# Patient Record
Sex: Male | Born: 1990 | Hispanic: No | Marital: Single | Smoking: Never smoker
Health system: Southern US, Community
[De-identification: ages and names within clinical notes are randomized; demographics above are authoritative.]

## PROBLEM LIST (undated history)

## (undated) HISTORY — PX: NO PAST SURGERIES: SHX2092

---
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
## 2017-12-01 ENCOUNTER — Other Ambulatory Visit: Payer: Self-pay

## 2017-12-01 ENCOUNTER — Ambulatory Visit
Admission: EM | Admit: 2017-12-01 | Discharge: 2017-12-01 | Disposition: A | Discharge status: Home / Self Care | Payer: BLUE CROSS/BLUE SHIELD | Attending: Emergency Medicine | Admitting: Emergency Medicine

## 2017-12-01 DIAGNOSIS — H9202 Otalgia, left ear: Secondary | ICD-10-CM | POA: Diagnosis not present

## 2017-12-01 DIAGNOSIS — H66002 Acute suppurative otitis media without spontaneous rupture of ear drum, left ear: Secondary | ICD-10-CM | POA: Diagnosis not present

## 2017-12-01 DIAGNOSIS — H6982 Other specified disorders of Eustachian tube, left ear: Secondary | ICD-10-CM

## 2017-12-01 MED ORDER — AMOXICILLIN 875 MG PO TABS: 875.0000 mg | ORAL_TABLET | Freq: Two times a day (BID) | ORAL | 0 refills | Status: AC

## 2017-12-01 MED ORDER — FLUTICASONE PROPIONATE 50 MCG/ACT NA SUSP: 2.0000 | Freq: Two times a day (BID) | NASAL | 2 refills | Status: DC

## 2017-12-01 NOTE — Discharge Instructions (Addendum)
Recommend start Amoxicillin 875mg  twice a day as directed. Start Flonase 2 sprays in left nostril twice a day for next 5 to 7 days. Continue Ibuprofen 600mg  every 6 hours as needed for pain. Follow-up here in 3 days if not improving.

## 2017-12-01 NOTE — ED Triage Notes (Signed)
Patient complains of left ear pain x 6 days that has been worsening. Patient reports that he has been taking allergy medication without relief.

## 2017-12-02 NOTE — ED Provider Notes (Signed)
[facility]    CSN: [id number] Arrival date & time: 12/01/17  1804     History   Chief Complaint Chief Complaint  Patient presents with  . Otalgia    left    HPI Nicholas [name] is a 27 y.o. male.   27 year old male presents with left sided ear pain for over a week that has become more painful in the past 2 days. Also having some dizziness, nausea and slight nasal congestion. Denies any fever, sore throat, cough or vomiting/diarrhea. He has taken OTC allergy medications, Ibuprofen and used hydrogen peroxide in his left ear with no relief. No other family members ill. No other chronic health issues. Takes no daily medication.   The history is provided by the patient.    History reviewed. No pertinent past medical history.  There are no active problems to display for this patient.   Past Surgical History:  Procedure Laterality Date  . NO PAST SURGERIES         Home Medications    Prior to Admission medications   Medication Sig Start Date End Date Taking? Authorizing Provider  amoxicillin (AMOXIL) 875 MG tablet Take 1 tablet (875 mg total) by mouth 2 (two) times daily for 7 days. 12/01/17 12/08/17  Sudie Grumbling, NP  fluticasone (FLONASE) 50 MCG/ACT nasal spray Place 2 sprays into both nostrils 2 (two) times daily for 7 days. 12/01/17 12/08/17  Sudie Grumbling, NP    Family History History reviewed. No pertinent family history.  Social History Social History   Tobacco Use  . Smoking status: Never Smoker  . Smokeless tobacco: Never Used  Substance Use Topics  . Alcohol use: Not Currently  . Drug use: Not Currently     Allergies   Patient has no known allergies.   Review of Systems Review of Systems  Constitutional: Positive for fatigue. Negative for activity change, appetite change, chills and fever.  HENT: Positive for congestion, ear pain and postnasal drip. Negative for ear discharge, facial swelling, hearing loss, mouth sores,  nosebleeds, rhinorrhea, sinus pressure, sinus pain, sneezing, sore throat, tinnitus and trouble swallowing.   Eyes: Negative for pain, discharge, redness and itching.  Respiratory: Negative for cough, chest tightness, shortness of breath and wheezing.   Gastrointestinal: Positive for nausea. Negative for diarrhea and vomiting.  Musculoskeletal: Negative for arthralgias, myalgias, neck pain and neck stiffness.  Skin: Negative for color change, rash and wound.  Allergic/Immunologic: Negative for immunocompromised state.  Neurological: Positive for dizziness, light-headedness and headaches. Negative for tremors, seizures, syncope, speech difficulty, weakness and numbness.  Hematological: Negative for adenopathy. Does not bruise/bleed easily.  Psychiatric/Behavioral: Negative.      Physical Exam Triage Vital Signs ED Triage Vitals  Enc Vitals Group     BP 12/01/17 1826 121/84     Pulse Rate 12/01/17 1826 68     Resp 12/01/17 1826 18     Temp 12/01/17 1826 98.1 F (36.7 C)     Temp Source 12/01/17 1826 Oral     SpO2 12/01/17 1826 100 %     Weight 12/01/17 1824 180 lb (81.6 kg)     Height 12/01/17 1824 5\' 9"  (1.753 m)     Head Circumference --      Peak Flow --      Pain Score 12/01/17 1824 8     Pain Loc --      Pain Edu? --      Excl. in GC? --  No data found.  Updated Vital Signs BP 121/84 (BP Location: Left Arm)   Pulse 68   Temp 98.1 F (36.7 C) (Oral)   Resp 18   Ht 5\' 9"  (1.753 m)   Wt 180 lb (81.6 kg)   SpO2 100%   BMI 26.58 kg/m   Visual Acuity Right Eye Distance:   Left Eye Distance:   Bilateral Distance:    Right Eye Near:   Left Eye Near:    Bilateral Near:     Physical Exam  Constitutional: He is oriented to person, place, and time. Vital signs are normal. He appears well-developed and well-nourished. He is cooperative. He appears ill. No distress.  Patient sitting comfortably on exam table in no acute distress but appears in pain.   HENT:  Head:  Normocephalic and atraumatic.  Right Ear: Hearing, external ear and ear canal normal. Tympanic membrane is bulging.  Left Ear: Hearing, external ear and ear canal normal. Tympanic membrane is injected and bulging. Tympanic membrane is not perforated. A middle ear effusion is present.  Nose: Nose normal. Right sinus exhibits no maxillary sinus tenderness and no frontal sinus tenderness. Left sinus exhibits no maxillary sinus tenderness and no frontal sinus tenderness.  Mouth/Throat: Uvula is midline, oropharynx is clear and moist and mucous membranes are normal.  Eyes: Conjunctivae and EOM are normal.  Neck: Normal range of motion. Neck supple.  Cardiovascular: Normal rate, regular rhythm and normal heart sounds.  No murmur heard. Pulmonary/Chest: Effort normal and breath sounds normal. No respiratory distress. He has no decreased breath sounds. He has no wheezes. He has no rhonchi. He has no rales.  Musculoskeletal: Normal range of motion.  Lymphadenopathy:       Head (right side): No preauricular and no posterior auricular adenopathy present.       Head (left side): Tonsillar (and slightly tender) adenopathy present. No preauricular and no posterior auricular adenopathy present.    He has no cervical adenopathy.  Neurological: He is alert and oriented to person, place, and time.  Skin: Skin is warm and dry. Capillary refill takes less than 2 seconds. No rash noted.  Psychiatric: He has a normal mood and affect. His behavior is normal. Judgment and thought content normal.  Vitals reviewed.    UC Treatments / Results  Labs (all labs ordered are listed, but only abnormal results are displayed) Labs Reviewed - No data to display  EKG None  Radiology No results found.  Procedures Procedures (including critical care time)  Medications Ordered in UC Medications - No data to display  Initial Impression / Assessment and Plan / UC Course  I have reviewed the triage vital signs and the  nursing notes.  Pertinent labs & imaging results that were available during my care of the patient were reviewed by me and considered in my medical decision making (see chart for details).    Reviewed with patient that he has a mild left ear infection and eustachian tube dysfunction. Recommend start Amoxicillin 875mg  twice a day as directed. Use Flonase 2 sprays in left nostril twice a day for next 5 to 7 days. May continue Ibuprofen 600mg  every 6 hours as needed for pain. Follow-up here in 3 to 4 days if not improving.  Final Clinical Impressions(s) / UC Diagnoses   Final diagnoses:  Acute suppurative otitis media of left ear without spontaneous rupture of tympanic membrane, recurrence not specified  Left ear pain  Acute dysfunction of left eustachian tube  Discharge Instructions     Recommend start Amoxicillin 875mg  twice a day as directed. Start Flonase 2 sprays in left nostril twice a day for next 5 to 7 days. Continue Ibuprofen 600mg  every 6 hours as needed for pain. Follow-up here in 3 days if not improving.     ED Prescriptions    Medication Sig Dispense Auth. Provider   amoxicillin (AMOXIL) 875 MG tablet Take 1 tablet (875 mg total) by mouth 2 (two) times daily for 7 days. 14 tablet Sudie Grumbling, NP   fluticasone (FLONASE) 50 MCG/ACT nasal spray Place 2 sprays into both nostrils 2 (two) times daily for 7 days. 16 g Sudie Grumbling, NP     Controlled Substance Prescriptions [place] Controlled Substance Registry consulted? Not Applicable   Sudie Grumbling, NP 12/02/17 [phone]

## 2018-05-06 ENCOUNTER — Ambulatory Visit
Admission: EM | Admit: 2018-05-06 | Discharge: 2018-05-06 | Disposition: A | Discharge status: Home / Self Care | Payer: BLUE CROSS/BLUE SHIELD | Attending: Family Medicine | Admitting: Family Medicine

## 2018-05-06 ENCOUNTER — Encounter: Payer: Self-pay | Admitting: Emergency Medicine

## 2018-05-06 ENCOUNTER — Other Ambulatory Visit: Payer: Self-pay

## 2018-05-06 DIAGNOSIS — R05 Cough: Secondary | ICD-10-CM | POA: Diagnosis not present

## 2018-05-06 DIAGNOSIS — J111 Influenza due to unidentified influenza virus with other respiratory manifestations: Secondary | ICD-10-CM

## 2018-05-06 DIAGNOSIS — R300 Dysuria: Secondary | ICD-10-CM | POA: Insufficient documentation

## 2018-05-06 DIAGNOSIS — R69 Illness, unspecified: Secondary | ICD-10-CM | POA: Diagnosis not present

## 2018-05-06 DIAGNOSIS — R0981 Nasal congestion: Secondary | ICD-10-CM | POA: Diagnosis not present

## 2018-05-06 LAB — URINALYSIS, COMPLETE (UACMP) WITH MICROSCOPIC
Bacteria, UA: NONE SEEN
Bilirubin Urine: NEGATIVE
GLUCOSE, UA: NEGATIVE mg/dL
Hgb urine dipstick: NEGATIVE
Ketones, ur: NEGATIVE mg/dL
Leukocytes,Ua: NEGATIVE
NITRITE: NEGATIVE
PH: 5.5 (ref 5.0–8.0)
PROTEIN: NEGATIVE mg/dL
Specific Gravity, Urine: 1.02 (ref 1.005–1.030)
Squamous Epithelial / LPF: NONE SEEN (ref 0–5)

## 2018-05-06 LAB — GLUCOSE, CAPILLARY: Glucose-Capillary: 80 mg/dL (ref 70–99)

## 2018-05-06 LAB — RAPID STREP SCREEN (MED CTR MEBANE ONLY): Streptococcus, Group A Screen (Direct): NEGATIVE

## 2018-05-06 LAB — RAPID INFLUENZA A&B ANTIGENS
Influenza A (ARMC): NEGATIVE
Influenza B (ARMC): NEGATIVE

## 2018-05-06 MED ORDER — OSELTAMIVIR PHOSPHATE 75 MG PO CAPS: 75.0000 mg | ORAL_CAPSULE | Freq: Two times a day (BID) | ORAL | 0 refills | Status: DC

## 2018-05-06 NOTE — ED Provider Notes (Addendum)
[facility] ____________________________________________  Time seen: Approximately 10:30 AM  I have reviewed the triage vital signs and the nursing notes.   HISTORY  Chief Complaint Cough; Chills; and Generalized Body Aches   HPI Nicholas [name] is a 28 y.o. male presenting for evaluation of 2 complaints.  Patient reports on Sunday he began noticing at night he was having some urinary frequency and urgency.  States he mostly notices this at night and not throughout the day.  States on Monday he began having cough, nasal congestion, sore throat, some aches and chills and fatigue.  States some accompanying body aches and low back aching.  States possible fever but did not measure.  Has been taken intermittent over-the-counter medication without resolution for congestion and cough.  States roommate recently sick with similar complaints.  States he did have also diarrhea yesterday, none today.  States some nausea but no vomiting.  Denies abdominal pain.  Denies any abnormal color stool or blood in stool.  Denies any penile or testicular pain or swelling, discharge or rash.  States not sexually active and denies any concerns of STDs.  States cough is making sore throat worse.  No recent unintentional weight loss.  States he has been drinking a lot of water, but unsure if that is contributing to his urinary frequency or not.  States mild sore throat currently.  Denies chest pain or shortness of breath.  No recent sickness.  Denies chronic medical problems.    History reviewed. No pertinent past medical history. Denies   There are no active problems to display for this patient.   Past Surgical History:  Procedure Laterality Date  . NO PAST SURGERIES       No current facility-administered medications for this encounter.   Current Outpatient Medications:  .  fluticasone (FLONASE) 50 MCG/ACT nasal spray, Place 2 sprays into both nostrils 2 (two) times daily for 7 days.,  Disp: 16 g, Rfl: 2 .  oseltamivir (TAMIFLU) 75 MG capsule, Take 1 capsule (75 mg total) by mouth every 12 (twelve) hours., Disp: 10 capsule, Rfl: 0  Allergies Patient has no known allergies.  History reviewed. No pertinent family history.  Social History Social History   Tobacco Use  . Smoking status: Never Smoker  . Smokeless tobacco: Never Used  Substance Use Topics  . Alcohol use: Yes    Comment: socially   . Drug use: Not Currently    Review of Systems Constitutional:possible fever. ENT: as above.  Cardiovascular: Denies chest pain. Respiratory: Denies shortness of breath. Gastrointestinal: No abdominal pain.as above.  Genitourinary: Positive for dysuria. Musculoskeletal: Intermittent back pain. Skin: Negative for rash. Neurological: Negative for focal weakness or numbness.   ____________________________________________   PHYSICAL EXAM:  VITAL SIGNS: ED Triage Vitals  Enc Vitals Group     BP 05/06/18 0944 118/85     Pulse Rate 05/06/18 0944 85     Resp 05/06/18 0944 18     Temp 05/06/18 0944 98.3 F (36.8 C)     Temp Source 05/06/18 0944 Oral     SpO2 05/06/18 0944 99 %     Weight 05/06/18 0945 175 lb (79.4 kg)     Height 05/06/18 0945 5\' 10"  (1.778 m)     Head Circumference --      Peak Flow --      Pain Score 05/06/18 0942 0     Pain Loc --      Pain Edu? --      Excl.  in GC? --    Constitutional: Alert and oriented. Well appearing and in no acute distress. Eyes: Conjunctivae are normal.  Head: Atraumatic. No sinus tenderness to palpation. No swelling. No erythema.  Ears: no erythema, normal TMs bilaterally.   Nose:Nasal congestion with clear rhinorrhea  Mouth/Throat: Mucous membranes are moist. Mild pharyngeal erythema. No tonsillar swelling or exudate.  Neck: No stridor.  No cervical spine tenderness to palpation. Hematological/Lymphatic/Immunilogical: No cervical lymphadenopathy. Cardiovascular: Normal rate, regular rhythm. Grossly normal heart  sounds.  Good peripheral circulation. Respiratory: Normal respiratory effort.  No retractions. No wheezes, rales or rhonchi. Good air movement.  Gastrointestinal: Soft and nontender. No CVA tenderness. Prostate: Declined chaperone.  Prostate warm, nontender, no firmness. Musculoskeletal: Ambulatory with steady gait. No cervical, thoracic or lumbar tenderness to palpation. Neurologic:  Normal speech and language. No gait instability. Skin:  Skin appears warm, dry and intact. No rash noted. Psychiatric: Mood and affect are normal. Speech and behavior are normal. ___________________________________________   LABS (all labs ordered are listed, but only abnormal results are displayed)  Labs Reviewed  RAPID INFLUENZA A&B ANTIGENS ([facility] ONLY)  RAPID STREP SCREEN ([facility] ONLY)  CULTURE, GROUP A STREP ([facility])  URINALYSIS, COMPLETE ([facility]) WITH MICROSCOPIC  GLUCOSE, CAPILLARY  CBG MONITORING, ED    PROCEDURES Procedures   INITIAL IMPRESSION / ASSESSMENT AND PLAN / ED COURSE  Pertinent labs & imaging results that were available during my care of the patient were reviewed by me and considered in my medical decision making (see chart for details).  Well-appearing patient.  No acute distress.  Suspect influenza-like illness.  Patient also with dysuria, declines any concerns and declines testing for STDs.  Prostate exam unremarkable.  Urinalysis unremarkable.  Blood sugar normal.  Counseled to closely monitor and follow-up with primary care or urology for continued complaints.  Strep negative.  Concern for possible influenza.  Discussed treatment options with patient, patient request Tamiflu, Rx given.  Encouraged over-the-counter cough, congestion, Tylenol ibuprofen, rest and fluids as needed.  Work note given for today and tomorrow.  Discussed very strict follow-up and return parameters.  Follow-up with primary care this week.  Follow-up with urology as needed for continued dysuria.Discussed  indication, risks and benefits of medications with patient.  Discussed follow up and return parameters including no resolution or any worsening concerns. Patient verbalized understanding and agreed to plan.   ____________________________________________   FINAL CLINICAL IMPRESSION(S) / ED DIAGNOSES  Final diagnoses:  Influenza-like illness  Dysuria     ED Discharge Orders         Ordered    oseltamivir (TAMIFLU) 75 MG capsule  Every 12 hours     05/06/18 1100           Note: This dictation was prepared with Dragon dictation along with smaller phrase technology. Any transcriptional errors that result from this process are unintentional.         Renford DillsMiller, Audyn Dimercurio, NP 05/06/18 1124    Renford DillsMiller, Eustacio Ellen, NP 05/06/18 1124

## 2018-05-06 NOTE — ED Triage Notes (Signed)
Patient also c/o urinary frequency, urgency and low back pain that started Sunday night.

## 2018-05-06 NOTE — ED Triage Notes (Signed)
Patient c/o cough, generalized body aches, chills that started on Monday.

## 2018-05-06 NOTE — Discharge Instructions (Signed)
Take medication as prescribed. Rest. Drink plenty of fluids. Over the counter cough, congestion medication, tylenol and ibuprofen as needed. Monitor self.   Follow up with your primary care physician or the above this week as needed. Return to Urgent care for new or worsening concerns.

## 2018-05-09 LAB — CULTURE, GROUP A STREP (THRC)

## 2018-06-11 ENCOUNTER — Other Ambulatory Visit: Payer: Self-pay

## 2018-06-11 ENCOUNTER — Ambulatory Visit (INDEPENDENT_AMBULATORY_CARE_PROVIDER_SITE_OTHER): Payer: BLUE CROSS/BLUE SHIELD

## 2018-06-11 ENCOUNTER — Ambulatory Visit
Admission: EM | Admit: 2018-06-11 | Discharge: 2018-06-11 | Disposition: A | Discharge status: Home / Self Care | Payer: BLUE CROSS/BLUE SHIELD | Attending: Family Medicine | Admitting: Family Medicine

## 2018-06-11 ENCOUNTER — Encounter: Payer: Self-pay | Admitting: Emergency Medicine

## 2018-06-11 DIAGNOSIS — J209 Acute bronchitis, unspecified: Secondary | ICD-10-CM

## 2018-06-11 DIAGNOSIS — R0602 Shortness of breath: Secondary | ICD-10-CM | POA: Diagnosis not present

## 2018-06-11 DIAGNOSIS — R05 Cough: Secondary | ICD-10-CM | POA: Diagnosis not present

## 2018-06-11 DIAGNOSIS — R51 Headache: Secondary | ICD-10-CM

## 2018-06-11 LAB — RAPID INFLUENZA A&B ANTIGENS: Influenza A (ARMC): NEGATIVE

## 2018-06-11 LAB — RAPID INFLUENZA A&B ANTIGENS (ARMC ONLY): INFLUENZA B (ARMC): NEGATIVE

## 2018-06-11 MED ORDER — NAPROXEN 500 MG PO TABS: 500.0000 mg | ORAL_TABLET | Freq: Two times a day (BID) | ORAL | 0 refills | Status: DC | PRN

## 2018-06-11 MED ORDER — ALBUTEROL SULFATE HFA 108 (90 BASE) MCG/ACT IN AERS: 1.0000 | INHALATION_SPRAY | Freq: Four times a day (QID) | RESPIRATORY_TRACT | 0 refills | Status: DC | PRN

## 2018-06-11 MED ORDER — BENZONATATE 200 MG PO CAPS: 200.0000 mg | ORAL_CAPSULE | Freq: Three times a day (TID) | ORAL | 0 refills | Status: DC | PRN

## 2018-06-11 NOTE — ED Provider Notes (Signed)
[facility]    CSN: [id number] Arrival date & time: 06/11/18  1108  History   Chief Complaint Chief Complaint  Patient presents with  . Cough  . Headache  . Shortness of Breath   HPI  28 year old Nicholas [name] presents with the above complaints.  Patient states that he has been sick since Tuesday.  He reports shortness of breath, headache, cough, postnasal drip, chills.  Has not checked his temperature at home.  He has felt feverish.  He states that his shortness of breath is most notably with exertion.  His cough is nonproductive.  He has tried over-the-counter allergy medication and Mucinex without improvement.  He has traveled to [place], [place], [place], [place].  No reported sick contacts.  No known relieving factors.  No other complaints.  PMH, Surgical Hx, Family Hx, Social History reviewed and updated as below.  PMH: Hx of paresthesia.  Past Surgical History:  Procedure Laterality Date  . NO PAST SURGERIES     Home Medications    Prior to Admission medications   Medication Sig Start Date End Date Taking? Authorizing Provider  fluticasone (FLONASE) 50 MCG/ACT nasal spray Place 2 sprays into both nostrils 2 (two) times daily for 7 days. 12/01/17 06/11/18 Yes Amyot, Ali Lowe, NP  albuterol (PROVENTIL HFA;VENTOLIN HFA) 108 (90 Base) MCG/ACT inhaler Inhale 1-2 puffs into the lungs every 6 (six) hours as needed for wheezing or shortness of breath. 06/11/18   Tommie Sams, DO  benzonatate (TESSALON) 200 MG capsule Take 1 capsule (200 mg total) by mouth 3 (three) times daily as needed for cough. 06/11/18   Tommie Sams, DO  naproxen (NAPROSYN) 500 MG tablet Take 1 tablet (500 mg total) by mouth 2 (two) times daily as needed for moderate pain or headache. 06/11/18   Tommie Sams, DO    Family History Family History  Problem Relation Age of Onset  . Healthy Mother   . Healthy Father     Social History Social History   Tobacco Use  . Smoking status: Never Smoker   . Smokeless tobacco: Never Used  Substance Use Topics  . Alcohol use: Yes    Comment: socially   . Drug use: Not Currently     Allergies   Patient has no known allergies.   Review of Systems Review of Systems Per HPI  Physical Exam Triage Vital Signs ED Triage Vitals  Enc Vitals Group     BP 06/11/18 1128 118/89     Pulse Rate 06/11/18 1128 93     Resp 06/11/18 1128 16     Temp 06/11/18 1128 98.6 F (37 C)     Temp Source 06/11/18 1128 Oral     SpO2 06/11/18 1128 99 %     Weight 06/11/18 1129 170 lb (77.1 kg)     Height 06/11/18 1129 5\' 10"  (1.778 m)     Head Circumference --      Peak Flow --      Pain Score 06/11/18 1128 7     Pain Loc --      Pain Edu? --      Excl. in GC? --    Updated Vital Signs BP 118/89 (BP Location: Left Arm)   Pulse 93   Temp 98.6 F (37 C) (Oral)   Resp 16   Ht 5\' 10"  (1.778 m)   Wt 77.1 kg   SpO2 99%   BMI 24.39 kg/m   Visual Acuity Right Eye Distance:   Left  Eye Distance:   Bilateral Distance:    Right Eye Near:   Left Eye Near:    Bilateral Near:     Physical Exam Vitals signs and nursing note reviewed.  Constitutional:      General: He is not in acute distress.    Appearance: He is well-developed.  HENT:     Head: Normocephalic and atraumatic.     Right Ear: Tympanic membrane normal.     Left Ear: Tympanic membrane normal.     Mouth/Throat:     Pharynx: Oropharynx is clear. No oropharyngeal exudate.  Eyes:     General:        Right eye: No discharge.        Left eye: No discharge.     Conjunctiva/sclera: Conjunctivae normal.  Cardiovascular:     Rate and Rhythm: Normal rate and regular rhythm.  Pulmonary:     Effort: Pulmonary effort is normal.     Breath sounds: Normal breath sounds. No wheezing, rhonchi or rales.  Neurological:     Mental Status: He is alert.  Psychiatric:        Mood and Affect: Mood normal.        Behavior: Behavior normal.    UC Treatments / Results  Labs (all labs ordered are  listed, but only abnormal results are displayed) Labs Reviewed  RAPID INFLUENZA A&B ANTIGENS ([facility] ONLY)    EKG None  Radiology Dg Chest 2 View  Result Date: 06/11/2018 CLINICAL DATA:  Productive cough and chest tightness with shortness of breath and chills. EXAM: CHEST - 2 VIEW COMPARISON:  None. FINDINGS: Normal sized heart. Clear lungs. Mild peribronchial thickening. Unremarkable bones. IMPRESSION: Mild bronchitic changes. Electronically Signed   By: Beckie Salts M.D.   On: 06/11/2018 12:28    Procedures Procedures (including critical care time)  Medications Ordered in UC Medications - No data to display  Initial Impression / Assessment and Plan / UC Course  I have reviewed the triage vital signs and the nursing notes.  Pertinent labs & imaging results that were available during my care of the patient were reviewed by me and considered in my medical decision making (see chart for details).    28 year old Nicholas [name] presents with respiratory symptoms.  Flu negative.  Exam reassuring.  X-ray revealed findings consistent with bronchitis.  Treated with Tessalon Perles, Aleve, and Albuterol.  Final Clinical Impressions(s) / UC Diagnoses   Final diagnoses:  Acute bronchitis, unspecified organism     Discharge Instructions     Medications as prescribed.  If you worsen, go to the hospital.  Take care  Dr. Adriana Simas     ED Prescriptions    Medication Sig Dispense Auth. Provider   benzonatate (TESSALON) 200 MG capsule Take 1 capsule (200 mg total) by mouth 3 (three) times daily as needed for cough. 30 capsule Evany Schecter G, DO   naproxen (NAPROSYN) 500 MG tablet Take 1 tablet (500 mg total) by mouth 2 (two) times daily as needed for moderate pain or headache. 30 tablet Chadd Tollison G, DO   albuterol (PROVENTIL HFA;VENTOLIN HFA) 108 (90 Base) MCG/ACT inhaler Inhale 1-2 puffs into the lungs every 6 (six) hours as needed for wheezing or shortness of breath. 1 Inhaler Tommie Sams, DO      Controlled Substance Prescriptions [place] Controlled Substance Registry consulted? Not Applicable   Tommie Sams, DO 06/11/18 1331

## 2018-06-11 NOTE — Discharge Instructions (Signed)
Medications as prescribed.  If you worsen, go to the hospital.  Take care  Dr. Mills Mitton  

## 2018-06-11 NOTE — ED Triage Notes (Signed)
Patient in today c/o cough, sob and headache x 3 days, getting worse. Patient states he has felt feverish. Patient has tried OTC allergy medicine and Mucinex.

## 2018-09-19 ENCOUNTER — Ambulatory Visit
Admission: EM | Admit: 2018-09-19 | Discharge: 2018-09-19 | Disposition: A | Discharge status: Home / Self Care | Payer: BC Managed Care – PPO | Attending: Emergency Medicine | Admitting: Emergency Medicine

## 2018-09-19 ENCOUNTER — Encounter: Payer: Self-pay | Admitting: Emergency Medicine

## 2018-09-19 ENCOUNTER — Other Ambulatory Visit: Payer: Self-pay

## 2018-09-19 DIAGNOSIS — J029 Acute pharyngitis, unspecified: Secondary | ICD-10-CM | POA: Diagnosis not present

## 2018-09-19 DIAGNOSIS — H6982 Other specified disorders of Eustachian tube, left ear: Secondary | ICD-10-CM

## 2018-09-19 LAB — RAPID STREP SCREEN (MED CTR MEBANE ONLY): Streptococcus, Group A Screen (Direct): NEGATIVE

## 2018-09-19 MED ORDER — PREDNISONE 10 MG (21) PO TBPK: ORAL_TABLET | ORAL | 0 refills | Status: DC

## 2018-09-19 NOTE — ED Triage Notes (Signed)
Patient c/o left ear pain and dizziness that started 2 weeks ago. Patient also c/o sore throat that started couple of days ago.  Patient denies fevers.

## 2018-09-19 NOTE — Discharge Instructions (Addendum)
Use Flonase and take antihistamine (allergy med) daily.

## 2018-09-19 NOTE — ED Provider Notes (Signed)
[facility]    CSN: [id number] Arrival date & time: 09/19/18  1348      History   Chief Complaint Chief Complaint  Patient presents with  . Sore Throat  . Otalgia    left    HPI Nicholas [name] is a 28 y.o. male presenting with pain to left each and right sided throat pain. Pt also states that he has occasional dizziness. Symptoms were exacerbated 3 days ago after he got out of pool. Pt attempted to treat with OTC throat lozenges and Flonase. Pt received short-lasting pain relief with treatments. Pt denies cough, SOB, wheezing, fever or recent travel.   History reviewed. No pertinent past medical history.  There are no active problems to display for this patient.   Past Surgical History:  Procedure Laterality Date  . NO PAST SURGERIES         Home Medications    Prior to Admission medications   Medication Sig Start Date End Date Taking? Authorizing Provider  albuterol (PROVENTIL HFA;VENTOLIN HFA) 108 (90 Base) MCG/ACT inhaler Inhale 1-2 puffs into the lungs every 6 (six) hours as needed for wheezing or shortness of breath. 06/11/18   Coral Spikes, DO  benzonatate (TESSALON) 200 MG capsule Take 1 capsule (200 mg total) by mouth 3 (three) times daily as needed for cough. 06/11/18   Coral Spikes, DO  fluticasone (FLONASE) 50 MCG/ACT nasal spray Place 2 sprays into both nostrils 2 (two) times daily for 7 days. 12/01/17 06/11/18  Katy Apo, NP  naproxen (NAPROSYN) 500 MG tablet Take 1 tablet (500 mg total) by mouth 2 (two) times daily as needed for moderate pain or headache. 06/11/18   Coral Spikes, DO  predniSONE (STERAPRED UNI-PAK 21 TAB) 10 MG (21) TBPK tablet Tale as instructed on package (60, 50, 40, 30, 20, 10) 09/19/18   Gertie Baron, NP    Family History Family History  Problem Relation Age of Onset  . Healthy Mother   . Healthy Father     Social History Social History   Tobacco Use  . Smoking status: Never Smoker  . Smokeless  tobacco: Never Used  Substance Use Topics  . Alcohol use: Yes    Comment: socially   . Drug use: Not Currently     Allergies   Patient has no known allergies.   Review of Systems Review of Systems  Constitutional: Negative for activity change, appetite change, chills, diaphoresis, fatigue and fever.  HENT: Positive for ear pain, postnasal drip and sore throat. Negative for hearing loss and sneezing.   Eyes: Negative for pain, discharge and itching.  Respiratory: Negative for cough, shortness of breath and wheezing.      Physical Exam Triage Vital Signs ED Triage Vitals  Enc Vitals Group     BP 09/19/18 1402 120/86     Pulse Rate 09/19/18 1402 95     Resp 09/19/18 1402 16     Temp 09/19/18 1402 98.4 F (36.9 C)     Temp Source 09/19/18 1402 Oral     SpO2 09/19/18 1402 97 %     Weight 09/19/18 1359 185 lb (83.9 kg)     Height 09/19/18 1359 5\' 10"  (1.778 m)     Head Circumference --      Peak Flow --      Pain Score 09/19/18 1359 6     Pain Loc --      Pain Edu? --      Excl.  in GC? --    No data found.  Updated Vital Signs BP 120/86 (BP Location: Left Arm)   Pulse 95   Temp 98.4 F (36.9 C) (Oral)   Resp 16   Ht 5\' 10"  (1.778 m)   Wt 185 lb (83.9 kg)   SpO2 97%   BMI 26.54 kg/m   Visual Acuity Right Eye Distance:   Left Eye Distance:   Bilateral Distance:    Right Eye Near:   Left Eye Near:    Bilateral Near:     Physical Exam Constitutional:      Appearance: He is normal weight.  HENT:     Right Ear: No drainage or tenderness. A middle ear effusion is present. Tympanic membrane is not erythematous.     Left Ear: Tympanic membrane normal.     Mouth/Throat:     Mouth: Mucous membranes are moist.     Pharynx: Posterior oropharyngeal erythema present.  Neck:     Musculoskeletal: Normal range of motion.  Cardiovascular:     Heart sounds: Normal heart sounds.  Pulmonary:     Breath sounds: Normal breath sounds.  Neurological:     Mental Status:  He is alert.      UC Treatments / Results  Labs (all labs ordered are listed, but only abnormal results are displayed) Labs Reviewed  RAPID STREP SCREEN ([facility] ONLY)  CULTURE, GROUP A STREP [facility])    EKG None  Radiology No results found.  Procedures Procedures (including critical care time)  Medications Ordered in UC Medications - No data to display  Initial Impression / Assessment and Plan / UC Course  I have reviewed the triage vital signs and the nursing notes.  Pertinent labs & imaging results that were available during my care of the patient were reviewed by me and considered in my medical decision making (see chart for details).     Pt presenting with left otalgia and pharyngitis. Strep screen negative; cx sent. Pt diagnosed with left eustachian tube dysfunction and treated with pred pack. Pt instructed to cont use of Flonase and add a daily anti histamine to regimen. Pt agreed to POC. All questions answered and all concerns addressed.   Final Clinical Impressions(s) / UC Diagnoses   Final diagnoses:  Eustachian tube dysfunction, left     Discharge Instructions     Use Flonase and take antihistamine (allergy med) daily.   ED Prescriptions    Medication Sig Dispense Auth. Provider   predniSONE (STERAPRED UNI-PAK 21 TAB) 10 MG (21) TBPK tablet Tale as instructed on package (60, 50, 40, 30, 20, 10) 1 each Bailey MechBenjamin, Melba Araki, NP       Bailey MechBenjamin, Luster Hechler, NP 09/19/18 1510

## 2018-09-22 LAB — CULTURE, GROUP A STREP (THRC)

## 2018-10-06 ENCOUNTER — Other Ambulatory Visit: Payer: Self-pay

## 2018-10-06 ENCOUNTER — Encounter: Payer: Self-pay | Admitting: Emergency Medicine

## 2018-10-06 ENCOUNTER — Telehealth: Payer: Self-pay | Admitting: *Deleted

## 2018-10-06 ENCOUNTER — Ambulatory Visit
Admission: EM | Admit: 2018-10-06 | Discharge: 2018-10-06 | Disposition: A | Discharge status: Home / Self Care | Payer: BC Managed Care – PPO | Attending: Urgent Care | Admitting: Urgent Care

## 2018-10-06 DIAGNOSIS — J029 Acute pharyngitis, unspecified: Secondary | ICD-10-CM | POA: Diagnosis not present

## 2018-10-06 DIAGNOSIS — B9689 Other specified bacterial agents as the cause of diseases classified elsewhere: Secondary | ICD-10-CM | POA: Diagnosis not present

## 2018-10-06 DIAGNOSIS — Z20822 Contact with and (suspected) exposure to covid-19: Secondary | ICD-10-CM

## 2018-10-06 DIAGNOSIS — Z7189 Other specified counseling: Secondary | ICD-10-CM | POA: Diagnosis not present

## 2018-10-06 DIAGNOSIS — J019 Acute sinusitis, unspecified: Secondary | ICD-10-CM

## 2018-10-06 LAB — RAPID STREP SCREEN (MED CTR MEBANE ONLY): Streptococcus, Group A Screen (Direct): NEGATIVE

## 2018-10-06 MED ORDER — BENZONATATE 200 MG PO CAPS: 200.0000 mg | ORAL_CAPSULE | Freq: Three times a day (TID) | ORAL | 0 refills | Status: DC | PRN

## 2018-10-06 MED ORDER — AMOXICILLIN-POT CLAVULANATE 875-125 MG PO TABS: 1.0000 | ORAL_TABLET | Freq: Two times a day (BID) | ORAL | 0 refills | Status: AC

## 2018-10-06 NOTE — Discharge Instructions (Signed)
It was very nice seeing you today in clinic. Thank you for entrusting me with your care.   Please utilize the medications that we discussed. Your prescriptions have been called in to your pharmacy. Stay HYDRATED. Water and electrolyte containing beverages (Gatorade, Pedialyte) are best to prevent dehydration and electrolyte abnormalities. May use Tylenol and/or Ibuprofen as needed for pain/fever.   Someone will contact you to schedule Corona testing. It will be drive up at [facility]. Wear your mask and stay in your vehicle.   Make arrangements to follow up with your regular doctor in 1 week for re-evaluation if not improving. If your symptoms/condition worsens, please seek follow up care either here or in the ER. Please remember, our [place] providers are "right here with you" when you need [place].   Again, it was my pleasure to take care of you today. Thank you for choosing our clinic. I hope that you start to feel better quickly.   Honor Loh, MSN, APRN, FNP-C, CEN Advanced Practice Provider [facility]

## 2018-10-06 NOTE — Telephone Encounter (Signed)
Spoke with patient, scheduled him for COVID 19 test 10/07/2018 at [facility].  Testing protocol reviewed.

## 2018-10-06 NOTE — Telephone Encounter (Signed)
-----   Message from Karen Kitchens, NP sent at 10/06/2018  2:18 PM EDT ----- Needs COVID testing.  Honor Loh, MSN, APRN, FNP-C, CEN Advanced Practice Provider [facility]

## 2018-10-06 NOTE — ED Triage Notes (Signed)
Patient states he developed a sore throat, head pain muscle pain and fatigue over the past 2 days

## 2018-10-06 NOTE — ED Provider Notes (Addendum)
[place], [place]   Name: Nicholas [name] DOB: 1990/09/25 MRN: [id number] CSN: [id number] PCP: Patient, No Pcp Per  Arrival date and time:  10/06/18 1234  Chief Complaint:  Sore Throat   NOTE: Prior to seeing the patient today, I have reviewed the triage nursing documentation and vital signs. Clinical staff has updated patient's PMH/PSHx, current medication list, and drug allergies/intolerances to ensure comprehensive history available to assist in medical decision making.   History:   HPI: Nicholas [name] is a 28 y.o. male who presents today with complaints of upper respiratory symptoms for the last 2 days. Patient with a non-productive cough, mild shortness of breath, sore throat, marked fatigue, otalgia, and myalgias. He has had low grade temperatures running in the 99 range. Patient denies an nausea, vomiting, and diarrhea. Patient is eating and drinking well. He denies any difficulties swallowing. He has not experienced any dysgeusia or dysosmia. Patient concerned about possible SARS-CoV-2 (novel coronavirus) infection; he has never been tested.   History reviewed. No pertinent past medical history.  Past Surgical History:  Procedure Laterality Date  . NO PAST SURGERIES      Family History  Problem Relation Age of Onset  . Healthy Mother   . Healthy Father     Social History   Tobacco Use  . Smoking status: Never Smoker  . Smokeless tobacco: Never Used  Substance Use Topics  . Alcohol use: Yes    Comment: socially   . Drug use: Not Currently    There are no active problems to display for this patient.   Home Medications:    Current Meds  Medication Sig  . naproxen (NAPROSYN) 500 MG tablet Take 1 tablet (500 mg total) by mouth 2 (two) times daily as needed for moderate pain or headache.    Allergies:   Patient has no known allergies.  Review of Systems (ROS): Review of Systems  Constitutional: Positive for fatigue and fever (low grade; running in 99  range). Negative for chills.  HENT: Positive for congestion, ear pain, rhinorrhea, sinus pressure and sore throat. Negative for ear discharge and trouble swallowing.   Respiratory: Positive for cough (non-productive) and shortness of breath (slight).   Cardiovascular: Negative for chest pain and palpitations.  Gastrointestinal: Negative for abdominal pain, diarrhea, nausea and vomiting.  Musculoskeletal: Positive for myalgias. Negative for arthralgias, back pain and neck pain.  Skin: Negative for color change, pallor and rash.  Neurological: Positive for weakness (generalized). Negative for dizziness, syncope and headaches.  Hematological: Negative for adenopathy.     Vital Signs: Today's Vitals   10/06/18 1337 10/06/18 1338 10/06/18 1339 10/06/18 1422  BP:      Pulse:      Resp:      Temp:      TempSrc:      SpO2:   100%   Weight:  185 lb (83.9 kg)    Height:  5\' 10"  (1.778 m)    PainSc: 3    2     Physical Exam: Physical Exam  Constitutional: He is oriented to person, place, and time and well-developed, well-nourished, and in no distress.  Non-toxic appearance. He has a sickly appearance. No distress.  HENT:  Head: Normocephalic and atraumatic.  Right Ear: Hearing and ear canal normal. No drainage or tenderness. Tympanic membrane is erythematous.  Left Ear: Hearing and ear canal normal. There is tenderness. No drainage. Tympanic membrane is erythematous. A middle ear effusion is present.  Nose: Mucosal edema, rhinorrhea and  sinus tenderness present.  Mouth/Throat: Uvula is midline and mucous membranes are normal. Posterior oropharyngeal erythema present. No oropharyngeal exudate or posterior oropharyngeal edema.  Eyes: Pupils are equal, round, and reactive to light. EOM are normal.  Neck: Normal range of motion. Neck supple.  Cardiovascular: Normal rate, regular rhythm, normal heart sounds and intact distal pulses. Exam reveals no gallop and no friction rub.  No murmur heard.  Pulmonary/Chest: Effort normal and breath sounds normal. No respiratory distress. He has no wheezes. He has no rales.  Neurological: He is alert and oriented to person, place, and time. Gait normal. GCS score is 15.  Skin: Skin is warm and dry. No rash noted.  Psychiatric: Mood, memory, affect and judgment normal.  Nursing note and vitals reviewed.   Urgent Care Treatments / Results:   LABS: PLEASE NOTE: all labs that were ordered this encounter are listed, however only abnormal results are displayed. Labs Reviewed  RAPID STREP SCREEN ([facility] ONLY)  CULTURE, GROUP A STREP [facility])   EKG: -None  RADIOLOGY: No results found.  PROCEDURES: Procedures  MEDICATIONS RECEIVED THIS VISIT: Medications - No data to display  PERTINENT CLINICAL COURSE NOTES/UPDATES:   Initial Impression / Assessment and Plan / Urgent Care Course:  Pertinent labs & imaging results that were available during my care of the patient were personally reviewed by me and considered in my medical decision making (see lab/imaging section of note for values and interpretations).  Nicholas [name] is a 28 y.o. male who [facility] with complaints of Sore Throat  Patient overall well appearing and in no acute distress today in clinic. Exam as per above. Rapid strep negative; reflex culture sent. Symptom constellation consistent with acute bacterial rhinosinusitis. Will cover with a 10 day course of Augmentin. Patient has used benzonatate in the past effectively; will refill prescription for PRN use. Discussed supportive care; rest, increased hydration, and PRN APAP/IBU for pain/fever.  Regarding concern related to SARS-CoV-2 (novel coronavirus). Reviewed typical symptoms with patient and decision was made to proceeding with testing. [facility] Patient advised that he will be contacted later today to schedule an appointment.  Additionally, he was advised to self quarantine, per [facility] guidelines, until negative results received.   Current clinical condition warrants patient being out of work in order to recover from his current injury/illness. He was provided with the appropriate documentation to provide to his place of employment that will allow for him to RTW on 10/09/2018 with no restrictions.   Discussed follow up with primary care physician in 1 week for re-evaluation. I have reviewed the follow up and strict return precautions for any new or worsening symptoms. Patient is aware of symptoms that would be deemed urgent/emergent, and would thus require further evaluation either here or in the emergency department. At the time of discharge, he verbalized understanding and consent with the discharge plan as it was reviewed with him. All questions were fielded by provider and/or clinic staff prior to patient discharge.    Final Clinical Impressions / Urgent Care Diagnoses:   Final diagnoses:  Educated About Covid-19 Virus Infection  Acute bacterial rhinosinusitis    New Prescriptions:  [place] Controlled Substance Registry consulted? Not Applicable  Meds ordered this encounter  Medications  . amoxicillin-clavulanate (AUGMENTIN) 875-125 MG tablet    Sig: Take 1 tablet by mouth 2 (two) times daily for 10 days.    Dispense:  20 tablet    Refill:  0  . benzonatate (TESSALON) 200 MG capsule    Sig: Take 1 capsule (200 mg total) by mouth 3 (three) times daily as needed for cough.    Dispense:  15 capsule    Refill:  0    Recommended Follow up Care:  Patient encouraged to follow up with the following provider within the specified time frame, or sooner as dictated by the severity of his symptoms. As always, he was instructed that for any urgent/emergent care needs, he should seek care either here or in the emergency department for more immediate evaluation.  Follow-up Information    PCP In 1 week.   Why: General  reassessment of symptoms if not improving       [facility].   Why: COVID testing - someone will contact you to schedule an appointment for you to be tested.        NOTE: This note was prepared using Scientist, clinical (histocompatibility and immunogenetics)Dragon dictation software along with smaller Lobbyistphrase technology. Despite my best ability to proofread, there is the potential that transcriptional errors may still occur from this process, and are completely unintentional.     Verlee MonteGray, Marrianne Sica E, NP 10/07/18 [phone]

## 2018-10-07 ENCOUNTER — Other Ambulatory Visit: Payer: BC Managed Care – PPO

## 2018-10-07 DIAGNOSIS — Z20822 Contact with and (suspected) exposure to covid-19: Secondary | ICD-10-CM

## 2018-10-09 LAB — CULTURE, GROUP A STREP (THRC)

## 2018-10-10 LAB — NOVEL CORONAVIRUS, NAA: SARS-CoV-2, NAA: NOT DETECTED

## 2018-11-16 ENCOUNTER — Encounter: Payer: Self-pay | Admitting: Emergency Medicine

## 2018-11-16 ENCOUNTER — Ambulatory Visit
Admission: EM | Admit: 2018-11-16 | Discharge: 2018-11-16 | Disposition: A | Discharge status: Home / Self Care | Payer: BC Managed Care – PPO | Attending: Family Medicine | Admitting: Family Medicine

## 2018-11-16 ENCOUNTER — Other Ambulatory Visit: Payer: Self-pay

## 2018-11-16 DIAGNOSIS — B9789 Other viral agents as the cause of diseases classified elsewhere: Secondary | ICD-10-CM | POA: Diagnosis not present

## 2018-11-16 DIAGNOSIS — J069 Acute upper respiratory infection, unspecified: Secondary | ICD-10-CM

## 2018-11-16 DIAGNOSIS — H6501 Acute serous otitis media, right ear: Secondary | ICD-10-CM | POA: Diagnosis not present

## 2018-11-16 MED ORDER — AMOXICILLIN 875 MG PO TABS: 875.0000 mg | ORAL_TABLET | Freq: Two times a day (BID) | ORAL | 0 refills | Status: DC

## 2018-11-16 NOTE — Discharge Instructions (Signed)
Rest, fluids, over the counter cold/cough medication Self quarantine  Await test result

## 2018-11-16 NOTE — ED Triage Notes (Signed)
Patient states he has a cough, headache and has felt like he has a fever and sinus congestion

## 2018-11-16 NOTE — ED Provider Notes (Signed)
[facility]    CSN: [id number] Arrival date & time: 11/16/18  1343      History   Chief Complaint Chief Complaint  Patient presents with  . Cough    HPI [name] is a 28 y.o. male.   28 yo male with a c/o cough, headaches, chills for the past 4 days. States ears feel stuffed up/plugged up. States about 2 weeks ago he was helping his brother move and his brother was sick with respiratory symptoms.    Cough   History reviewed. No pertinent past medical history.  There are no active problems to display for this patient.   Past Surgical History:  Procedure Laterality Date  . NO PAST SURGERIES         Home Medications    Prior to Admission medications   Medication Sig Start Date End Date Taking? Authorizing Provider  albuterol (PROVENTIL HFA;VENTOLIN HFA) 108 (90 Base) MCG/ACT inhaler Inhale 1-2 puffs into the lungs every 6 (six) hours as needed for wheezing or shortness of breath. 06/11/18   Tommie Samsook, Jayce G, DO  amoxicillin (AMOXIL) 875 MG tablet Take 1 tablet (875 mg total) by mouth 2 (two) times daily. 11/16/18   Payton Mccallumonty, Yorley Buch, MD  benzonatate (TESSALON) 200 MG capsule Take 1 capsule (200 mg total) by mouth 3 (three) times daily as needed for cough. 10/06/18   Verlee MonteGray, Bryan E, NP  fluticasone (FLONASE) 50 MCG/ACT nasal spray Place 2 sprays into both nostrils 2 (two) times daily for 7 days. 12/01/17 06/11/18  Sudie GrumblingAmyot, Ann Berry, NP  naproxen (NAPROSYN) 500 MG tablet Take 1 tablet (500 mg total) by mouth 2 (two) times daily as needed for moderate pain or headache. 06/11/18   Tommie Samsook, Jayce G, DO    Family History Family History  Problem Relation Age of Onset  . Healthy Mother   . Healthy Father     Social History Social History   Tobacco Use  . Smoking status: Never Smoker  . Smokeless tobacco: Never Used  Substance Use Topics  . Alcohol use: Yes    Comment: socially   . Drug use: Not Currently     Allergies   Patient has no known allergies.    Review of Systems Review of Systems  Respiratory: Positive for cough.      Physical Exam Triage Vital Signs ED Triage Vitals  Enc Vitals Group     BP 11/16/18 1411 (!) 124/93     Pulse Rate 11/16/18 1411 82     Resp 11/16/18 1411 18     Temp 11/16/18 1411 98.2 F (36.8 C)     Temp Source 11/16/18 1411 Oral     SpO2 11/16/18 1411 100 %     Weight 11/16/18 1415 185 lb (83.9 kg)     Height 11/16/18 1415 5\' 10"  (1.778 m)     Head Circumference --      Peak Flow --      Pain Score 11/16/18 1414 6     Pain Loc --      Pain Edu? --      Excl. in GC? --    No data found.  Updated Vital Signs BP (!) 124/93   Pulse 82   Temp 98.2 F (36.8 C) (Oral)   Resp 18   Ht 5\' 10"  (1.778 m)   Wt 83.9 kg   SpO2 100%   BMI 26.54 kg/m   Visual Acuity Right Eye Distance:   Left Eye Distance:  Bilateral Distance:    Right Eye Near:   Left Eye Near:    Bilateral Near:     Physical Exam Vitals signs and nursing note reviewed.  Constitutional:      General: He is not in acute distress.    Appearance: He is not toxic-appearing or diaphoretic.  HENT:     Right Ear: A middle ear effusion is present. Tympanic membrane is erythematous and bulging.     Left Ear: Tympanic membrane normal.  Cardiovascular:     Rate and Rhythm: Normal rate and regular rhythm.     Heart sounds: Normal heart sounds.  Pulmonary:     Effort: Pulmonary effort is normal. No respiratory distress.     Breath sounds: Normal breath sounds. No stridor. No wheezing, rhonchi or rales.  Neurological:     Mental Status: He is alert.      UC Treatments / Results  Labs (all labs ordered are listed, but only abnormal results are displayed) Labs Reviewed  NOVEL CORONAVIRUS, NAA (HOSPITAL ORDER, SEND-OUT TO REF LAB)    EKG   Radiology No results found.  Procedures Procedures (including critical care time)  Medications Ordered in UC Medications - No data to display  Initial Impression / Assessment  and Plan / UC Course  I have reviewed the triage vital signs and the nursing notes.  Pertinent labs & imaging results that were available during my care of the patient were reviewed by me and considered in my medical decision making (see chart for details).      Final Clinical Impressions(s) / UC Diagnoses   Final diagnoses:  Viral URI with cough  Right acute serous otitis media, recurrence not specified     Discharge Instructions     Rest, fluids, over the counter cold/cough medication Self quarantine  Await test result    ED Prescriptions    Medication Sig Dispense Auth. Provider   amoxicillin (AMOXIL) 875 MG tablet Take 1 tablet (875 mg total) by mouth 2 (two) times daily. 20 tablet Norval Gable, MD      1.diagnosis reviewed with patient 2. rx as per orders above; reviewed possible side effects, interactions, risks and benefits  3. Recommend supportive treatment as above 4. covid test done 5. Follow-up prn if symptoms worsen or don't improve   Controlled Substance Prescriptions [place] Controlled Substance Registry consulted? Not Applicable   Norval Gable, MD 11/16/18 [phone]

## 2018-11-17 LAB — NOVEL CORONAVIRUS, NAA (HOSP ORDER, SEND-OUT TO REF LAB; TAT 18-24 HRS): SARS-CoV-2, NAA: NOT DETECTED

## 2018-11-18 ENCOUNTER — Encounter (HOSPITAL_COMMUNITY): Payer: Self-pay

## 2018-11-24 ENCOUNTER — Other Ambulatory Visit: Payer: Self-pay

## 2018-11-24 ENCOUNTER — Ambulatory Visit (INDEPENDENT_AMBULATORY_CARE_PROVIDER_SITE_OTHER): Payer: BC Managed Care – PPO

## 2018-11-24 ENCOUNTER — Ambulatory Visit
Admission: EM | Admit: 2018-11-24 | Discharge: 2018-11-24 | Disposition: A | Discharge status: Home / Self Care | Payer: BC Managed Care – PPO | Attending: Family Medicine | Admitting: Family Medicine

## 2018-11-24 ENCOUNTER — Encounter: Payer: Self-pay | Admitting: Emergency Medicine

## 2018-11-24 DIAGNOSIS — R05 Cough: Secondary | ICD-10-CM | POA: Diagnosis not present

## 2018-11-24 DIAGNOSIS — J209 Acute bronchitis, unspecified: Secondary | ICD-10-CM

## 2018-11-24 MED ORDER — PREDNISONE 50 MG PO TABS: ORAL_TABLET | ORAL | 0 refills | Status: DC

## 2018-11-24 MED ORDER — HYDROCOD POLST-CPM POLST ER 10-8 MG/5ML PO SUER: 5.0000 mL | Freq: Two times a day (BID) | ORAL | 0 refills | Status: DC | PRN

## 2018-11-24 NOTE — Discharge Instructions (Signed)
Medications as prescribed. ° °Take care ° °Dr. Anahit Klumb  °

## 2018-11-24 NOTE — ED Provider Notes (Signed)
[facility]    CSN: [id number] Arrival date & time: 11/24/18  1006  History   Chief Complaint Chief Complaint  Patient presents with  . Cough  . Nasal Congestion   HPI  28 year old male presents with persistent respiratory symptoms.  Patient recently seen on 8/24.  Placed on amoxicillin for otitis media.  Patient reports that he continues to feel poorly.  His COVID-19 test was negative.  He reports continued cough with associated chest discomfort.  Worse at night.  He reports associated shortness of breath as well.  Also reports fatigue.  He endorses compliance with current antibiotic therapy.  He is now out of cough medication.  Patient is concerned given persistent symptoms.  No fever.  No known exacerbating or relieving factors.  No other associated symptoms.  No other complaints.  PMH, Surgical Hx, Family Hx, Social History reviewed and updated as below.  PMH: Back pain, chronic  Past Surgical History:  Procedure Laterality Date  . NO PAST SURGERIES     Home Medications    Prior to Admission medications   Medication Sig Start Date End Date Taking? Authorizing Provider  amoxicillin (AMOXIL) 875 MG tablet Take 1 tablet (875 mg total) by mouth 2 (two) times daily. 11/16/18  Yes Norval Gable, MD  albuterol (PROVENTIL HFA;VENTOLIN HFA) 108 (90 Base) MCG/ACT inhaler Inhale 1-2 puffs into the lungs every 6 (six) hours as needed for wheezing or shortness of breath. 06/11/18   Coral Spikes, DO  chlorpheniramine-HYDROcodone (TUSSIONEX PENNKINETIC ER) 10-8 MG/5ML SUER Take 5 mLs by mouth every 12 (twelve) hours as needed. 11/24/18   Coral Spikes, DO  predniSONE (DELTASONE) 50 MG tablet 1 tablet daily x 5 days 11/24/18   Thersa Salt G, DO  fluticasone (FLONASE) 50 MCG/ACT nasal spray Place 2 sprays into both nostrils 2 (two) times daily for 7 days. 12/01/17 11/24/18  Katy Apo, NP    Family History Family History  Problem Relation Age of Onset  . Healthy Mother   .  Healthy Father     Social History Social History   Tobacco Use  . Smoking status: Never Smoker  . Smokeless tobacco: Never Used  Substance Use Topics  . Alcohol use: Yes    Comment: socially   . Drug use: Not Currently     Allergies   Patient has no known allergies.   Review of Systems Review of Systems  Constitutional: Positive for fatigue. Negative for fever.  Respiratory: Positive for cough and shortness of breath.    Physical Exam Triage Vital Signs ED Triage Vitals  Enc Vitals Group     BP 11/24/18 1035 114/81     Pulse Rate 11/24/18 1035 82     Resp 11/24/18 1035 18     Temp 11/24/18 1035 98.4 F (36.9 C)     Temp Source 11/24/18 1035 Oral     SpO2 11/24/18 1035 100 %     Weight 11/24/18 1036 190 lb (86.2 kg)     Height 11/24/18 1036 5\' 10"  (1.778 m)     Head Circumference --      Peak Flow --      Pain Score 11/24/18 1036 5     Pain Loc --      Pain Edu? --      Excl. in [place]? --    Updated Vital Signs BP 114/81 (BP Location: Right Arm)   Pulse 82   Temp 98.4 F (36.9 C) (Oral)   Resp 18  Ht 5\' 10"  (1.778 m)   Wt 86.2 kg   SpO2 100%   BMI 27.26 kg/m   Visual Acuity Right Eye Distance:   Left Eye Distance:   Bilateral Distance:    Right Eye Near:   Left Eye Near:    Bilateral Near:     Physical Exam Vitals signs and nursing note reviewed.  Constitutional:      General: He is not in acute distress.    Appearance: Normal appearance. He is not ill-appearing.  HENT:     Head: Normocephalic and atraumatic.     Mouth/Throat:     Pharynx: Oropharynx is clear. No oropharyngeal exudate.  Eyes:     General:        Right eye: No discharge.        Left eye: No discharge.     Conjunctiva/sclera: Conjunctivae normal.  Cardiovascular:     Rate and Rhythm: Normal rate and regular rhythm.     Heart sounds: No murmur.  Pulmonary:     Effort: Pulmonary effort is normal.     Breath sounds: Normal breath sounds. No wheezing, rhonchi or rales.   Neurological:     Mental Status: He is alert.  Psychiatric:        Mood and Affect: Mood normal.        Behavior: Behavior normal.    UC Treatments / Results  Labs (all labs ordered are listed, but only abnormal results are displayed) Labs Reviewed - No data to display  EKG   Radiology No results found.  Procedures Procedures (including critical care time)  Medications Ordered in UC Medications - No data to display  Initial Impression / Assessment and Plan / UC Course  I have reviewed the triage vital signs and the nursing notes.  Pertinent labs & imaging results that were available during my care of the patient were reviewed by me and considered in my medical decision making (see chart for details).    28 year old male presents with acute bronchitis.  Chest x-ray done by me today.  Radiology read is not available at this time.  Interpretation by me: Peribronchial thickening.  Otherwise negative.  No acute infiltrate.  Discharging home on prednisone, Tussionex.  Supportive care.  Final Clinical Impressions(s) / UC Diagnoses   Final diagnoses:  Acute bronchitis, unspecified organism     Discharge Instructions     Medications as prescribed.  Take care  Dr. Adriana Simasook     ED Prescriptions    Medication Sig Dispense Auth. Provider   predniSONE (DELTASONE) 50 MG tablet 1 tablet daily x 5 days 5 tablet Margerie Fraiser G, DO   chlorpheniramine-HYDROcodone (TUSSIONEX PENNKINETIC ER) 10-8 MG/5ML SUER Take 5 mLs by mouth every 12 (twelve) hours as needed. 115 mL Tommie Samsook, Yasiel Goyne G, DO     Controlled Substance Prescriptions [place] Controlled Substance Registry consulted? Not Applicable   Tommie SamsCook, Shruthi Northrup G, DO 11/24/18 1242

## 2018-11-24 NOTE — ED Triage Notes (Addendum)
Patient c/o cough and nasal congestion x 2 weeks, states he was getting better and returned to work yesterday and the cough became worse causing him to have chest pain. He states it kept him awake at night. He said he woke up this morning and his fingers were tingling.  Tested for COVID 1 week ago and was negative.

## 2019-03-12 ENCOUNTER — Other Ambulatory Visit: Payer: Self-pay

## 2019-03-12 ENCOUNTER — Ambulatory Visit
Admission: EM | Admit: 2019-03-12 | Discharge: 2019-03-12 | Disposition: A | Discharge status: Home / Self Care | Payer: Self-pay | Attending: Family Medicine | Admitting: Family Medicine

## 2019-03-12 ENCOUNTER — Encounter: Payer: Self-pay | Admitting: Emergency Medicine

## 2019-03-12 DIAGNOSIS — H00014 Hordeolum externum left upper eyelid: Secondary | ICD-10-CM

## 2019-03-12 MED ORDER — FLUORESCEIN SODIUM 1 MG OP STRP: 1.0000 | ORAL_STRIP | Freq: Once | OPHTHALMIC | Status: DC

## 2019-03-12 MED ORDER — TETRACAINE HCL 0.5 % OP SOLN: 1.0000 [drp] | Freq: Once | OPHTHALMIC | Status: DC

## 2019-03-12 MED ORDER — ERYTHROMYCIN 5 MG/GM OP OINT: 1.0000 "application " | TOPICAL_OINTMENT | Freq: Three times a day (TID) | OPHTHALMIC | 0 refills | Status: AC

## 2019-03-12 NOTE — ED Provider Notes (Signed)
[facility] ____________________________________________  Time seen: Approximately 9:28 AM  I have reviewed the triage vital signs and the nursing notes.   HISTORY  Chief Complaint Eye Pain (left)   HPI Nicholas [name] is a 28 y.o. male presenting for evaluation of left eyelid swelling for the last 3 days.  States the area is tender and painful.  Denies any injury, trauma, foreign body or foreign body sensation.  Denies vision changes.  States the eyelid is the tender area.  Denies any other tenderness to face, redness or swelling.  Denies history of the same.  Does not wear glasses or contacts.  Denies recent cough, fevers, sore throat or sickness.  Has applied heat a few times.  Denies other aggravating or alleviating factors.   History reviewed. No pertinent past medical history. Denies   There are no problems to display for this patient.   Past Surgical History:  Procedure Laterality Date  . NO PAST SURGERIES        Current Facility-Administered Medications:  .  fluorescein ophthalmic strip 1 strip, 1 strip, Left Eye, Once, Marylene Land, NP .  tetracaine (PONTOCAINE) 0.5 % ophthalmic solution 1 drop, 1 drop, Left Eye, Once, Marylene Land, NP  Current Outpatient Medications:  .  erythromycin ophthalmic ointment, Place 1 application into the left eye 3 (three) times daily for 7 days. For seven days, Disp: 3.5 g, Rfl: 0  Allergies Patient has no known allergies.  Family History  Problem Relation Age of Onset  . Healthy Mother   . Healthy Father     Social History Social History   Tobacco Use  . Smoking status: Never Smoker  . Smokeless tobacco: Never Used  Substance Use Topics  . Alcohol use: Yes    Comment: socially   . Drug use: Not Currently    Review of Systems Constitutional: No fever Eyes: No visual changes.  As above. ENT: No sore throat. Cardiovascular: Denies chest pain. Respiratory: Denies shortness of  breath. Gastrointestinal: No abdominal pain.   Musculoskeletal: Negative for back pain. Skin: Negative for rash.   ____________________________________________   PHYSICAL EXAM:  VITAL SIGNS: ED Triage Vitals  Enc Vitals Group     BP 03/12/19 0924 120/85     Pulse Rate 03/12/19 0924 89     Resp 03/12/19 0924 16     Temp 03/12/19 0924 98.4 F (36.9 C)     Temp Source 03/12/19 0924 Oral     SpO2 03/12/19 0924 100 %     Weight 03/12/19 0924 185 lb (83.9 kg)     Height 03/12/19 0924 5\' 10"  (1.778 m)     Head Circumference --      Peak Flow --      Pain Score 03/12/19 0923 6     Pain Loc --      Pain Edu? --      Excl. in [place]? --      Visual Acuity  Right Eye Distance: 20/30 Left Eye Distance: 20/25 Bilateral Distance: 20/25   Constitutional: Alert and oriented. Well appearing and in no acute distress. Eyes: Conjunctivae are normal. PERRL. EOMI. no pain with EOMs.  Left upper outer eyelid mid to lateral along the eyelash margin area of erythema and localized edema with tenderness.  No other surrounding tenderness, swelling, erythema.  Left eye examined with tetracaine and fluorescein, no corneal abrasion or dye uptake noted. ENT      Head: Normocephalic and atraumatic. Cardiovascular: Normal heart rate.  Respiratory: Normal  respiratory effort without tachypnea nor retractions.  Musculoskeletal: Steady gait.  Neurologic:  Normal speech and language. Skin:  Skin is warm, dry and intact. No rash noted. Psychiatric: Mood and affect are normal. Speech and behavior are normal. Patient exhibits appropriate insight and judgment   ___________________________________________   LABS (all labs ordered are listed, but only abnormal results are displayed)  Labs Reviewed - No data to display  PROCEDURES Procedures   Eye exam Procedure explained and verbal consent obtained.  Anesthesia: tetracaine ophthalmic 2 drops Left eye examined with fluorescein strip.  No foreign bodies  visualized. No corneal abrasion noted.  Patient tolerated well.    INITIAL IMPRESSION / ASSESSMENT AND PLAN / ED COURSE  Pertinent labs & imaging results that were available during my care of the patient were reviewed by me and considered in my medical decision making (see chart for details).  Well-appearing patient.  No acute distress.  Left eyelid swelling.  Left upper eyelid stye.  Will conservatively treat with erythromycin ophthalmic ointment as patient frequently touching.  Encourage frequent warm compresses, monitoring.Discussed indication, risks and benefits of medications with patient.   Discussed follow up with Primary care physician this week. Discussed follow up and return parameters including no resolution or any worsening concerns. Patient verbalized understanding and agreed to plan.   ____________________________________________   FINAL CLINICAL IMPRESSION(S) / ED DIAGNOSES  Final diagnoses:  Hordeolum externum of left upper eyelid     ED Discharge Orders         Ordered    erythromycin ophthalmic ointment  3 times daily     03/12/19 0951           Note: This dictation was prepared with Dragon dictation along with smaller phrase technology. Any transcriptional errors that result from this process are unintentional.         Renford Dills, NP 03/12/19 1010

## 2019-03-12 NOTE — ED Triage Notes (Signed)
Patient in today c/o left eye pain and swelling x 3 days.

## 2019-03-12 NOTE — Discharge Instructions (Signed)
Take medication as prescribed. Rest. Drink plenty of fluids. Frequent warm compresses. Monitor.   Follow up with your primary care physician this week as needed. Return to Urgent care for new or worsening concerns.

## 2019-04-06 ENCOUNTER — Ambulatory Visit: Payer: BC Managed Care – PPO | Attending: Internal Medicine

## 2019-04-06 DIAGNOSIS — Z20822 Contact with and (suspected) exposure to covid-19: Secondary | ICD-10-CM | POA: Insufficient documentation

## 2019-04-07 LAB — NOVEL CORONAVIRUS, NAA: SARS-CoV-2, NAA: NOT DETECTED

## 2019-04-08 ENCOUNTER — Encounter: Payer: Self-pay | Admitting: Emergency Medicine

## 2019-04-08 ENCOUNTER — Ambulatory Visit (INDEPENDENT_AMBULATORY_CARE_PROVIDER_SITE_OTHER): Payer: BC Managed Care – PPO

## 2019-04-08 ENCOUNTER — Ambulatory Visit
Admission: EM | Admit: 2019-04-08 | Discharge: 2019-04-08 | Disposition: A | Discharge status: Home / Self Care | Payer: BC Managed Care – PPO | Attending: Internal Medicine | Admitting: Internal Medicine

## 2019-04-08 ENCOUNTER — Other Ambulatory Visit: Payer: Self-pay

## 2019-04-08 DIAGNOSIS — R05 Cough: Secondary | ICD-10-CM | POA: Diagnosis not present

## 2019-04-08 DIAGNOSIS — R0602 Shortness of breath: Secondary | ICD-10-CM | POA: Diagnosis present

## 2019-04-08 DIAGNOSIS — R110 Nausea: Secondary | ICD-10-CM | POA: Diagnosis not present

## 2019-04-08 DIAGNOSIS — E860 Dehydration: Secondary | ICD-10-CM | POA: Insufficient documentation

## 2019-04-08 DIAGNOSIS — R197 Diarrhea, unspecified: Secondary | ICD-10-CM | POA: Insufficient documentation

## 2019-04-08 DIAGNOSIS — R1031 Right lower quadrant pain: Secondary | ICD-10-CM | POA: Insufficient documentation

## 2019-04-08 DIAGNOSIS — G44209 Tension-type headache, unspecified, not intractable: Secondary | ICD-10-CM | POA: Insufficient documentation

## 2019-04-08 DIAGNOSIS — Z20822 Contact with and (suspected) exposure to covid-19: Secondary | ICD-10-CM | POA: Insufficient documentation

## 2019-04-08 DIAGNOSIS — R5383 Other fatigue: Secondary | ICD-10-CM | POA: Insufficient documentation

## 2019-04-08 LAB — COMPREHENSIVE METABOLIC PANEL
ALT: 28 U/L (ref 0–44)
AST: 20 U/L (ref 15–41)
Albumin: 4.5 g/dL (ref 3.5–5.0)
Alkaline Phosphatase: 52 U/L (ref 38–126)
Anion gap: 8 (ref 5–15)
BUN: 17 mg/dL (ref 6–20)
CO2: 23 mmol/L (ref 22–32)
Calcium: 9 mg/dL (ref 8.9–10.3)
Chloride: 104 mmol/L (ref 98–111)
Creatinine, Ser: 0.71 mg/dL (ref 0.61–1.24)
GFR calc Af Amer: >60 mL/min (ref >60)
GFR calc non Af Amer: >60 mL/min (ref >60)
Glucose, Bld: 133 mg/dL — ABNORMAL HIGH (ref 70–99)
Potassium: 3.7 mmol/L (ref 3.5–5.1)
Sodium: 135 mmol/L (ref 135–145)
Total Bilirubin: 0.4 mg/dL (ref 0.3–1.2)
Total Protein: 7.7 g/dL (ref 6.5–8.1)

## 2019-04-08 LAB — CBC WITH DIFFERENTIAL/PLATELET
Abs Immature Granulocytes: 0.03 10*3/uL (ref 0.00–0.07)
Basophils Absolute: 0.1 10*3/uL (ref 0.0–0.1)
Basophils Relative: 1 %
Eosinophils Absolute: 0.2 10*3/uL (ref 0.0–0.5)
Eosinophils Relative: 3 %
HCT: 41.8 % (ref 39.0–52.0)
Hemoglobin: 14.6 g/dL (ref 13.0–17.0)
Immature Granulocytes: 0 %
Lymphocytes Relative: 28 %
Lymphs Abs: 2.1 10*3/uL (ref 0.7–4.0)
MCH: 28.1 pg (ref 26.0–34.0)
MCHC: 34.9 g/dL (ref 30.0–36.0)
MCV: 80.5 fL (ref 80.0–100.0)
Monocytes Absolute: 0.5 10*3/uL (ref 0.1–1.0)
Monocytes Relative: 6 %
Neutro Abs: 4.6 10*3/uL (ref 1.7–7.7)
Neutrophils Relative %: 62 %
Platelets: 321 10*3/uL (ref 150–400)
RBC: 5.19 MIL/uL (ref 4.22–5.81)
RDW: 12.6 % (ref 11.5–15.5)
WBC: 7.4 10*3/uL (ref 4.0–10.5)
nRBC: 0 % (ref 0.0–0.2)

## 2019-04-08 MED ORDER — ALBUTEROL SULFATE HFA 108 (90 BASE) MCG/ACT IN AERS: 2.0000 | INHALATION_SPRAY | RESPIRATORY_TRACT | 0 refills | Status: DC | PRN

## 2019-04-08 MED ORDER — METHYLPREDNISOLONE 4 MG PO TBPK: ORAL_TABLET | ORAL | 0 refills | Status: DC

## 2019-04-08 NOTE — Discharge Instructions (Addendum)
You have mild dehydration which can cause the rapid heart beat. Push more water than what  you have been drinking. Your EKG just shows slight rapid heart beat, but otherwise is normal. Your lab work shows sugar elevation of 133, normal is less than 99, but could be you ate or drank something within the past 2 hours. Make sure you follow up with your family Dr to get a diabetes test  Called hemoglobin A1C. I am going to have you try an inhaler to see if this helps you breath better, if after 24 hours of using this you get worse, or there is no change, please go to the ER.  If your R lower abdominal pain gets worse, also go to the ER, you will need more test than what we can do here.

## 2019-04-08 NOTE — ED Triage Notes (Signed)
Patient c/o headache, shortness of breath, loose bowel movements, fatigue that started on Friday January 8th. Positive exposure on Monday January 4th. Patient was tested for COVID on January 12th and was negative.

## 2019-04-08 NOTE — ED Provider Notes (Addendum)
[facility]    CSN: [id number] Arrival date & time: 04/08/19  1640      History   Chief Complaint Chief Complaint  Patient presents with  . Headache  . Shortness of Breath  . Fatigue    HPI Nicholas [name] is a 29 y.o. male. who developed HA, SOB loose stools, cough and body aches x 4 day. Denies being sick this past month. SOB is present while sitting, but gets worse while walking. Thinks he may have had a fever this weekend but did not have a thermometer to check. Denies pleuritic chest pain. Has been using his inhaler every 6h which helps a little.  He had about 5 BM's on the first day, and since then 1-2 per day. Has noticed change in his taste, and is able still able to smell. In the past 48 h has had RLQ pain which seems provoked after eating and causes nausea.  Has been taking vit C and D.  Has had pneumonia 2 years ago. Since then has had frequent bronchitis episodes.  His cough is productive with occasional production but has not looked. Denies chest pains, but feels substernal tightness intermittently at rest or with activity.  Denies long car rides or flights, having calf pain or edema.      History reviewed. No pertinent past medical history.  There are no problems to display for this patient.   Past Surgical History:  Procedure Laterality Date  . NO PAST SURGERIES         Home Medications    Prior to Admission medications   Medication Sig Start Date End Date Taking? Authorizing Provider  albuterol (VENTOLIN HFA) 108 (90 Base) MCG/ACT inhaler Inhale 2 puffs into the lungs every 4 (four) hours as needed for wheezing or shortness of breath. 04/08/19   Rodriguez-Southworth, Nettie Elm, PA-C  fluticasone (FLONASE) 50 MCG/ACT nasal spray Place 2 sprays into both nostrils 2 (two) times daily for 7 days. 12/01/17 11/24/18  Sudie Grumbling, NP    Family History Family History  Problem Relation Age of Onset  . Healthy Mother   . Healthy Father      Social History Social History   Tobacco Use  . Smoking status: Never Smoker  . Smokeless tobacco: Never Used  Substance Use Topics  . Alcohol use: Yes    Comment: socially   . Drug use: Not Currently     Allergies   Patient has no known allergies.   Review of Systems Review of Systems  Constitutional: Positive for appetite change and fatigue. Negative for activity change, chills, diaphoresis and fever.  HENT: Negative for congestion, ear pain, postnasal drip, rhinorrhea, sore throat and trouble swallowing.   Eyes: Negative for discharge.  Respiratory: Positive for cough, chest tightness and shortness of breath. Negative for wheezing.   Cardiovascular: Negative for chest pain, palpitations and leg swelling.  Gastrointestinal: Positive for abdominal pain. Negative for abdominal distention, constipation, diarrhea, nausea and vomiting.  Endocrine: Positive for polydipsia.  Genitourinary: Negative for difficulty urinating.  Musculoskeletal: Positive for myalgias. Negative for gait problem.  Skin: Negative for rash.  Neurological: Positive for dizziness and headaches. Negative for weakness and numbness.  Hematological: Negative for adenopathy.  Psychiatric/Behavioral:       Admits having a lot of stress     Physical Exam Triage Vital Signs ED Triage Vitals  Enc Vitals Group     BP 04/08/19 1655 130/87     Pulse Rate 04/08/19 1655 (!) 111  Resp 04/08/19 1655 18     Temp 04/08/19 1655 98.3 F (36.8 C)     Temp Source 04/08/19 1655 Oral     SpO2 04/08/19 1655 98 %     Weight 04/08/19 1653 185 lb (83.9 kg)     Height 04/08/19 1653 5\' 10"  (1.778 m)     Head Circumference --      Peak Flow --      Pain Score 04/08/19 1653 5     Pain Loc --      Pain Edu? --      Excl. in [place]? --     Constitutional: he is oriented to person, place, and time. he appears well-developed and well-nourished. No distress.  HENT:  Head: Normocephalic and atraumatic.  Right Ear:  External ear normal.  Left Ear: External ear normal.  Nose: Nose normal.  Eyes: Conjunctivae are normal. Right eye exhibits no discharge. Left eye exhibits no discharge. No scleral icterus.  Neck: Neck supple. No thyromegaly present.  No carotid bruits bilaterally  Cardiovascular: Normal rate and regular rhythm.  No murmur heard. Pulmonary/Chest: Effort normal and breath sounds normal. No respiratory distress.  Abdomen- + BS, soft, moderate tenderness present on RLQ with rebound. No HSM noted.  Musculoskeletal: Normal range of motion. He exhibits no edema.  Lymphadenopathy:    he has no cervical adenopathy.  Neurological: he is alert and oriented to person, place, and time.  Skin: Skin is warm and dry. Capillary refill takes less than 2 seconds. No rash noted. he is not diaphoretic.  Psychiatric: He has a normal mood and affect. His behavior is normal. Judgment and thought content normal.  Nursing note reviewed.    Updated Vital Signs BP 130/87 (BP Location: Right Arm)   Pulse (!) 111   Temp 98.3 F (36.8 C) (Oral)   Resp 18   Ht 5\' 10"  (1.778 m)   Wt 185 lb (83.9 kg)   SpO2 98%   BMI 26.54 kg/m   Visual Acuity Right Eye Distance:   Left Eye Distance:   Bilateral Distance:    Right Eye Near:   Left Eye Near:    Bilateral Near:     Physical Exam Vitals and nursing note reviewed.  Constitutional:      General: He is not in acute distress.    Appearance: He is not toxic-appearing.  HENT:     Head: Normocephalic.     Comments: R temple is tender, no masses, nodes or swelling or temporal artery noted.  Eyes:     General: No scleral icterus.    Extraocular Movements: Extraocular movements intact.     Right eye: No nystagmus.     Left eye: No nystagmus.     Pupils: Pupils are equal, round, and reactive to light.  Cardiovascular:     Rate and Rhythm: Regular rhythm. Tachycardia present.     Heart sounds: Normal heart sounds. No murmur. No friction rub. No gallop.    Pulmonary:     Effort: Pulmonary effort is normal.     Breath sounds: Normal breath sounds.  Abdominal:     General: Bowel sounds are normal. There is no distension.     Palpations: Abdomen is soft. There is no mass.     Tenderness: There is abdominal tenderness. There is no guarding.     Comments: RLQ. Neg psoas sign.   Musculoskeletal:        General: Normal range of motion.     Cervical back:  Neck supple. No rigidity.  Lymphadenopathy:     Cervical: No cervical adenopathy.  Skin:    General: Skin is warm and dry.     Findings: No rash.  Neurological:     Mental Status: He is alert and oriented to person, place, and time.     Cranial Nerves: No facial asymmetry.     Motor: No weakness.     Gait: Gait normal.  Psychiatric:        Mood and Affect: Mood normal.        Speech: Speech normal.        Behavior: Behavior normal.     Comments: Admits of being under a lot of stress in the past month      UC Treatments / Results  Labs (all labs ordered are listed, but only abnormal results are displayed) Labs Reviewed  COMPREHENSIVE METABOLIC PANEL - Abnormal; Notable for the following components:      Result Value   Glucose, Bld 133 (*)    All other components within normal limits  NOVEL CORONAVIRUS, NAA (HOSP ORDER, SEND-OUT TO REF LAB; TAT 18-24 HRS)  CBC WITH DIFFERENTIAL/PLATELET  CBC with diff is normal  EKG   Radiology DG Chest 2 View  Result Date: 04/08/2019 CLINICAL DATA:  29 year old male with shortness of breath and cough. EXAM: CHEST - 2 VIEW COMPARISON:  Chest radiograph dated 11/24/2018. FINDINGS: The heart size and mediastinal contours are within normal limits. Both lungs are clear. The visualized skeletal structures are unremarkable. IMPRESSION: No active cardiopulmonary disease. Electronically Signed   By: Anner Crete M.D.   On: 04/08/2019 17:37    Procedures Procedures (including critical care time)  Medications Ordered in UC Medications - No  data to display  Initial Impression / Assessment and Plan / UC Course  I have reviewed the triage vital signs and the nursing notes. Pertinent labs & imaging results that were available during my care of the patient were reviewed by me and considered in my medical decision making (see chart for details). CXR is neg.  Placed on Medrol dose pack. May use his proair inhaler q 4h.  Placed on Albuterol inhaler, and if SOB persists in the next 24h, needs to go to ER. If abdominal pain gets worse, needs to go to ER.  Needs to push more fluids to hydrate more. May take Tylenol and Ibuprofen together prn HA.   Final Clinical Impressions(s) / UC Diagnoses   Final diagnoses:  Shortness of breath  Dehydration  Tension headache  Right lower quadrant abdominal pain     Discharge Instructions     You have mild dehydration which can cause the rapid heart beat. Push more water than what  you have been drinking. Your EKG just shows slight rapid heart beat, but otherwise is normal. Your lab work shows sugar elevation of 133, normal is less than 99, but could be you ate or drank something within the past 2 hours. Make sure you follow up with your family Dr to get a diabetes test  Called hemoglobin A1C. I am going to have you try an inhaler to see if this helps you breath better, if after 24 hours of using this you get worse, or there is no change, please go to the ER.  If your R lower abdominal pain gets worse, also go to the ER, you will need more test than what we can do here.     ED Prescriptions    Medication Sig Dispense  Auth. Provider   albuterol (VENTOLIN HFA) 108 (90 Base) MCG/ACT inhaler Inhale 2 puffs into the lungs every 4 (four) hours as needed for wheezing or shortness of breath. 18 g Rodriguez-Southworth, Nettie Elm, PA-C     PDMP not reviewed this encounter.   Garey Ham, PA-C 04/08/19 1915    Rodriguez-Southworth, [place], PA-C 04/08/19 1925

## 2019-04-10 LAB — NOVEL CORONAVIRUS, NAA (HOSP ORDER, SEND-OUT TO REF LAB; TAT 18-24 HRS): SARS-CoV-2, NAA: NOT DETECTED

## 2019-12-15 ENCOUNTER — Ambulatory Visit
Admission: EM | Admit: 2019-12-15 | Discharge: 2019-12-15 | Disposition: A | Discharge status: Home / Self Care | Payer: BC Managed Care – PPO | Attending: Family Medicine | Admitting: Family Medicine

## 2019-12-15 ENCOUNTER — Other Ambulatory Visit: Payer: Self-pay

## 2019-12-15 ENCOUNTER — Encounter: Payer: Self-pay | Admitting: Emergency Medicine

## 2019-12-15 DIAGNOSIS — M542 Cervicalgia: Secondary | ICD-10-CM

## 2019-12-15 MED ORDER — TIZANIDINE HCL 4 MG PO TABS: 4.0000 mg | ORAL_TABLET | Freq: Three times a day (TID) | ORAL | 0 refills | Status: DC | PRN

## 2019-12-15 MED ORDER — MELOXICAM 15 MG PO TABS: 15.0000 mg | ORAL_TABLET | Freq: Every day | ORAL | 0 refills | Status: DC | PRN

## 2019-12-15 NOTE — ED Triage Notes (Signed)
Pt c/o neck pain. He also states he feels like there is a lump in his throat. Started about 3-4 weeks.

## 2019-12-15 NOTE — Discharge Instructions (Signed)
Rest.  Heat.  Medication as prescribed.  Take care  Dr. Makailey Hodgkin  

## 2019-12-15 NOTE — ED Provider Notes (Signed)
[facility]    CSN: [id number] Arrival date & time: 12/15/19  1742      History   Chief Complaint Chief Complaint  Patient presents with  . Neck Pain   HPI  29 year old male presents with the above complaint.  Patient reports a 3 to 4-week history of posterior neck pain.  He also reports some anterior neck tightness and pain.  He states that this has been steadily worsening particular of the past 2 weeks.  Denies sore throat.  No fall, trauma, injury.  No difficulty swallowing.  No difficulty breathing.  He has tried some stretches but has not taking any medication for his pain.  Pain currently 8/10 in severity.  No other complaints this time.  Past Surgical History:  Procedure Laterality Date  . NO PAST SURGERIES     Home Medications    Prior to Admission medications   Medication Sig Start Date End Date Taking? Authorizing Provider  meloxicam (MOBIC) 15 MG tablet Take 1 tablet (15 mg total) by mouth daily as needed for pain. 12/15/19   Tommie Sams, DO  tiZANidine (ZANAFLEX) 4 MG tablet Take 1 tablet (4 mg total) by mouth every 8 (eight) hours as needed for muscle spasms. 12/15/19   Tommie Sams, DO  albuterol (VENTOLIN HFA) 108 (90 Base) MCG/ACT inhaler Inhale 2 puffs into the lungs every 4 (four) hours as needed for wheezing or shortness of breath. 04/08/19 12/15/19  Rodriguez-Southworth, Nettie Elm, PA-C  fluticasone (FLONASE) 50 MCG/ACT nasal spray Place 2 sprays into both nostrils 2 (two) times daily for 7 days. 12/01/17 11/24/18  Sudie Grumbling, NP    Family History Family History  Problem Relation Age of Onset  . Healthy Mother   . Healthy Father     Social History Social History   Tobacco Use  . Smoking status: Never Smoker  . Smokeless tobacco: Never Used  Vaping Use  . Vaping Use: Never used  Substance Use Topics  . Alcohol use: Yes    Comment: socially   . Drug use: Not Currently     Allergies   Patient has no known allergies.   Review of  Systems Review of Systems  Musculoskeletal: Positive for neck pain.   Physical Exam Triage Vital Signs ED Triage Vitals  Enc Vitals Group     BP 12/15/19 1833 120/90     Pulse Rate 12/15/19 1833 68     Resp 12/15/19 1833 18     Temp 12/15/19 1833 98.2 F (36.8 C)     Temp Source 12/15/19 1833 Oral     SpO2 12/15/19 1833 100 %     Weight 12/15/19 1831 184 lb 15.5 oz (83.9 kg)     Height 12/15/19 1831 5\' 10"  (1.778 m)     Head Circumference --      Peak Flow --      Pain Score 12/15/19 1831 8     Pain Loc --      Pain Edu? --      Excl. in GC? --    Updated Vital Signs BP 120/90 (BP Location: Left Arm)   Pulse 68   Temp 98.2 F (36.8 C) (Oral)   Resp 18   Ht 5\' 10"  (1.778 m)   Wt 83.9 kg   SpO2 100%   BMI 26.54 kg/m   Visual Acuity Right Eye Distance:   Left Eye Distance:   Bilateral Distance:    Right Eye Near:   Left Eye Near:  Bilateral Near:     Physical Exam Vitals and nursing note reviewed.  Constitutional:      General: He is not in acute distress.    Appearance: Normal appearance.  HENT:     Head: Normocephalic and atraumatic.  Eyes:     General:        Right eye: No discharge.        Left eye: No discharge.     Conjunctiva/sclera: Conjunctivae normal.  Cardiovascular:     Rate and Rhythm: Normal rate and regular rhythm.     Heart sounds: No murmur heard.   Pulmonary:     Effort: Pulmonary effort is normal.     Breath sounds: Normal breath sounds. No wheezing, rhonchi or rales.  Musculoskeletal:     Cervical back: Normal range of motion and neck supple.  Neurological:     Mental Status: He is alert.  Psychiatric:        Mood and Affect: Mood normal.        Behavior: Behavior normal.    UC Treatments / Results  Labs (all labs ordered are listed, but only abnormal results are displayed) Labs Reviewed - No data to display  EKG   Radiology No results found.  Procedures Procedures (including critical care time)  Medications  Ordered in UC Medications - No data to display  Initial Impression / Assessment and Plan / UC Course  I have reviewed the triage vital signs and the nursing notes.  Pertinent labs & imaging results that were available during my care of the patient were reviewed by me and considered in my medical decision making (see chart for details).    29 year old male presents with neck pain.  Treating with Zanaflex and Mobic as this seems to be musculoskeletal.  Final Clinical Impressions(s) / UC Diagnoses   Final diagnoses:  Neck pain     Discharge Instructions     Rest.  Heat.  Medication as prescribed.  Take care  Dr. Adriana Simas     ED Prescriptions    Medication Sig Dispense Auth. Provider   tiZANidine (ZANAFLEX) 4 MG tablet Take 1 tablet (4 mg total) by mouth every 8 (eight) hours as needed for muscle spasms. 20 tablet Tanea Moga G, DO   meloxicam (MOBIC) 15 MG tablet Take 1 tablet (15 mg total) by mouth daily as needed for pain. 30 tablet Tommie Sams, DO     PDMP not reviewed this encounter.   Tommie Sams, [place] 12/15/19 2004

## 2020-03-14 ENCOUNTER — Ambulatory Visit
Admission: EM | Admit: 2020-03-14 | Discharge: 2020-03-14 | Disposition: A | Discharge status: Home / Self Care | Payer: BC Managed Care – PPO | Attending: Family Medicine | Admitting: Family Medicine

## 2020-03-14 ENCOUNTER — Other Ambulatory Visit: Payer: Self-pay

## 2020-03-14 ENCOUNTER — Encounter: Payer: Self-pay | Admitting: Emergency Medicine

## 2020-03-14 DIAGNOSIS — R197 Diarrhea, unspecified: Secondary | ICD-10-CM | POA: Diagnosis not present

## 2020-03-14 MED ORDER — ONDANSETRON HCL 4 MG PO TABS: 4.0000 mg | ORAL_TABLET | Freq: Three times a day (TID) | ORAL | 0 refills | Status: DC | PRN

## 2020-03-14 MED ORDER — DIPHENOXYLATE-ATROPINE 2.5-0.025 MG PO TABS: 1.0000 | ORAL_TABLET | Freq: Four times a day (QID) | ORAL | 0 refills | Status: DC | PRN

## 2020-03-14 NOTE — ED Provider Notes (Signed)
[facility]    CSN: [id number] Arrival date & time: 03/14/20  1448      History   Chief Complaint Chief Complaint  Patient presents with  . Diarrhea   HPI  29 year old male presents with diarrhea.  Patient reports that he developed diarrhea on Sunday.  He has had numerous episodes.  Reports associated nausea but no vomiting.  No fever.  Some mild abdominal discomfort.  He is augmenting his diet without resolution.  No other interventions tried.  No other associated symptoms.  No other complaints.   Past Surgical History:  Procedure Laterality Date  . NO PAST SURGERIES     Home Medications    Prior to Admission medications   Medication Sig Start Date End Date Taking? Authorizing Provider  diphenoxylate-atropine (LOMOTIL) 2.5-0.025 MG tablet Take 1 tablet by mouth 4 (four) times daily as needed for diarrhea or loose stools. 03/14/20   Tommie Sams, DO  ondansetron (ZOFRAN) 4 MG tablet Take 1 tablet (4 mg total) by mouth every 8 (eight) hours as needed for nausea or vomiting. 03/14/20   Tommie Sams, DO  albuterol (VENTOLIN HFA) 108 (90 Base) MCG/ACT inhaler Inhale 2 puffs into the lungs every 4 (four) hours as needed for wheezing or shortness of breath. 04/08/19 12/15/19  Rodriguez-Southworth, Nettie Elm, PA-C  fluticasone (FLONASE) 50 MCG/ACT nasal spray Place 2 sprays into both nostrils 2 (two) times daily for 7 days. 12/01/17 11/24/18  Sudie Grumbling, NP    Family History Family History  Problem Relation Age of Onset  . Healthy Mother   . Healthy Father     Social History Social History   Tobacco Use  . Smoking status: Never Smoker  . Smokeless tobacco: Never Used  Vaping Use  . Vaping Use: Never used  Substance Use Topics  . Alcohol use: Yes    Comment: socially   . Drug use: Not Currently     Allergies   Patient has no known allergies.   Review of Systems Review of Systems  Constitutional: Positive for appetite change.  Gastrointestinal:  Positive for diarrhea and nausea. Negative for vomiting.   Physical Exam Triage Vital Signs ED Triage Vitals [03/14/20 1456]  Enc Vitals Group     BP 119/90     Pulse Rate 100     Resp 18     Temp 98.3 F (36.8 C)     Temp Source Oral     SpO2 99 %     Weight 190 lb (86.2 kg)     Height 5\' 10"  (1.778 m)     Head Circumference      Peak Flow      Pain Score 6     Pain Loc      Pain Edu?      Excl. in GC?    Updated Vital Signs BP 119/90 (BP Location: Right Arm)   Pulse 100   Temp 98.3 F (36.8 C) (Oral)   Resp 18   Ht 5\' 10"  (1.778 m)   Wt 86.2 kg   SpO2 99%   BMI 27.26 kg/m   Visual Acuity Right Eye Distance:   Left Eye Distance:   Bilateral Distance:    Right Eye Near:   Left Eye Near:    Bilateral Near:     Physical Exam Vitals and nursing note reviewed.  Constitutional:      General: He is not in acute distress.    Appearance: Normal appearance. He is not ill-appearing.  HENT:     Head: Normocephalic and atraumatic.     Mouth/Throat:     Pharynx: Oropharynx is clear. No oropharyngeal exudate or posterior oropharyngeal erythema.  Cardiovascular:     Rate and Rhythm: Normal rate and regular rhythm.  Pulmonary:     Effort: Pulmonary effort is normal.     Breath sounds: Normal breath sounds. No wheezing, rhonchi or rales.  Abdominal:     General: There is no distension.     Palpations: Abdomen is soft.     Tenderness: There is no abdominal tenderness.  Neurological:     Mental Status: He is alert.  Psychiatric:        Mood and Affect: Mood normal.        Behavior: Behavior normal.    UC Treatments / Results  Labs (all labs ordered are listed, but only abnormal results are displayed) Labs Reviewed - No data to display  EKG   Radiology No results found.  Procedures Procedures (including critical care time)  Medications Ordered in UC Medications - No data to display  Initial Impression / Assessment and Plan / UC Course  I have reviewed  the triage vital signs and the nursing notes.  Pertinent labs & imaging results that were available during my care of the patient were reviewed by me and considered in my medical decision making (see chart for details).    29 year old male presents with acute diarrhea.  Suspect viral gastroenteritis.  No indications for stool testing at this time.  Treating symptomatically with Zofran and Lomotil.  Work note given.  Supportive care.  Final Clinical Impressions(s) / UC Diagnoses   Final diagnoses:  Diarrhea, unspecified type   Discharge Instructions   None    ED Prescriptions    Medication Sig Dispense Auth. Provider   ondansetron (ZOFRAN) 4 MG tablet Take 1 tablet (4 mg total) by mouth every 8 (eight) hours as needed for nausea or vomiting. 20 tablet Taleigha Pinson G, DO   diphenoxylate-atropine (LOMOTIL) 2.5-0.025 MG tablet Take 1 tablet by mouth 4 (four) times daily as needed for diarrhea or loose stools. 30 tablet Tommie Sams, DO     PDMP not reviewed this encounter.   Tommie Sams, [place] 03/14/20 1554

## 2020-03-14 NOTE — ED Triage Notes (Signed)
Patient c/o diarrhea that started on Sunday. Denies any other symptoms.

## 2020-04-12 ENCOUNTER — Ambulatory Visit
Admission: EM | Admit: 2020-04-12 | Discharge: 2020-04-12 | Disposition: A | Discharge status: Home / Self Care | Payer: BC Managed Care – PPO | Attending: Emergency Medicine | Admitting: Emergency Medicine

## 2020-04-12 ENCOUNTER — Other Ambulatory Visit: Payer: Self-pay

## 2020-04-12 DIAGNOSIS — J069 Acute upper respiratory infection, unspecified: Secondary | ICD-10-CM

## 2020-04-12 MED ORDER — BENZONATATE 100 MG PO CAPS: 200.0000 mg | ORAL_CAPSULE | Freq: Three times a day (TID) | ORAL | 0 refills | Status: DC

## 2020-04-12 MED ORDER — PROMETHAZINE-DM 6.25-15 MG/5ML PO SYRP: 5.0000 mL | ORAL_SOLUTION | Freq: Four times a day (QID) | ORAL | 0 refills | Status: DC | PRN

## 2020-04-12 NOTE — ED Triage Notes (Signed)
Patient states that he has been having a cough with nasal congestion x 3 weeks. Reports that he has had 2 negative covid test over the course of symptoms.

## 2020-04-12 NOTE — ED Provider Notes (Signed)
[facility]    CSN: [id number] Arrival date & time: 04/12/20  1400      History   Chief Complaint Chief Complaint  Patient presents with  . Cough    HPI Nicholas [name] is a 30 y.o. male.   HPI   30 year old male here for evaluation of cough and nasal congestion that been going on the past 3 weeks.  Patient is had multiple exposures to COVID and also multiple tests.  All of his test have been negative so far.  Patient's last known COVID exposure was 1 week ago and his last PCR test was performed yesterday.  He got his results back today and they are negative for COVID.  Patient states that his cough is nonproductive but is associated with some mild intermittent shortness of breath.  Patient denies fever, wheezing, runny nose, sore throat, GI complaints, or body aches.  Patient has had his COVID-vaccine but not his booster shot.  Patient has not had his flu shot.  History reviewed. No pertinent past medical history.  There are no problems to display for this patient.   Past Surgical History:  Procedure Laterality Date  . NO PAST SURGERIES         Home Medications    Prior to Admission medications   Medication Sig Start Date End Date Taking? Authorizing Provider  benzonatate (TESSALON) 100 MG capsule Take 2 capsules (200 mg total) by mouth every 8 (eight) hours. 04/12/20  Yes Becky Augusta, NP  promethazine-dextromethorphan (PROMETHAZINE-DM) 6.25-15 MG/5ML syrup Take 5 mLs by mouth 4 (four) times daily as needed. 04/12/20  Yes Becky Augusta, NP  ondansetron (ZOFRAN) 4 MG tablet Take 1 tablet (4 mg total) by mouth every 8 (eight) hours as needed for nausea or vomiting. 03/14/20   Tommie Sams, DO  albuterol (VENTOLIN HFA) 108 (90 Base) MCG/ACT inhaler Inhale 2 puffs into the lungs every 4 (four) hours as needed for wheezing or shortness of breath. 04/08/19 12/15/19  Rodriguez-Southworth, Nettie Elm, PA-C  fluticasone (FLONASE) 50 MCG/ACT nasal spray Place 2 sprays  into both nostrils 2 (two) times daily for 7 days. 12/01/17 11/24/18  Sudie Grumbling, NP    Family History Family History  Problem Relation Age of Onset  . Healthy Mother   . Healthy Father     Social History Social History   Tobacco Use  . Smoking status: Never Smoker  . Smokeless tobacco: Never Used  Vaping Use  . Vaping Use: Never used  Substance Use Topics  . Alcohol use: Yes    Comment: socially   . Drug use: Not Currently     Allergies   Patient has no known allergies.   Review of Systems Review of Systems  Constitutional: Positive for fatigue. Negative for activity change, appetite change and fever.  HENT: Positive for congestion. Negative for ear pain and rhinorrhea.   Respiratory: Positive for cough and shortness of breath. Negative for wheezing.   Gastrointestinal: Negative for diarrhea and vomiting.  Musculoskeletal: Negative for arthralgias and myalgias.  Skin: Negative for rash.  Hematological: Negative.      Physical Exam Triage Vital Signs ED Triage Vitals  Enc Vitals Group     BP 04/12/20 1626 110/86     Pulse Rate 04/12/20 1626 72     Resp 04/12/20 1626 18     Temp 04/12/20 1626 98.3 F (36.8 C)     Temp Source 04/12/20 1626 Oral     SpO2 --  Weight 04/12/20 1624 190 lb (86.2 kg)     Height 04/12/20 1624 5\' 10"  (1.778 m)     Head Circumference --      Peak Flow --      Pain Score 04/12/20 1624 4     Pain Loc --      Pain Edu? --      Excl. in GC? --    No data found.  Updated Vital Signs BP 110/86 (BP Location: Left Arm)   Pulse 72   Temp 98.3 F (36.8 C) (Oral)   Resp 18   Ht 5\' 10"  (1.778 m)   Wt 190 lb (86.2 kg)   BMI 27.26 kg/m   Visual Acuity Right Eye Distance:   Left Eye Distance:   Bilateral Distance:    Right Eye Near:   Left Eye Near:    Bilateral Near:     Physical Exam Vitals and nursing note reviewed.  Constitutional:      General: He is not in acute distress.    Appearance: Normal appearance. He is  not toxic-appearing.  HENT:     Head: Normocephalic and atraumatic.     Right Ear: Tympanic membrane, ear canal and external ear normal.     Left Ear: Tympanic membrane, ear canal and external ear normal.     Nose: Congestion and rhinorrhea present.     Comments: The mucosa is erythematous and edematous with clear nasal discharge.    Mouth/Throat:     Mouth: Mucous membranes are moist.     Pharynx: Oropharynx is clear. No oropharyngeal exudate or posterior oropharyngeal erythema.  Cardiovascular:     Rate and Rhythm: Normal rate and regular rhythm.     Pulses: Normal pulses.     Heart sounds: Normal heart sounds. No murmur heard. No gallop.   Pulmonary:     Effort: Pulmonary effort is normal.     Breath sounds: Normal breath sounds. No wheezing, rhonchi or rales.  Musculoskeletal:     Cervical back: Normal range of motion and neck supple.  Lymphadenopathy:     Cervical: No cervical adenopathy.  Skin:    General: Skin is warm and dry.     Capillary Refill: Capillary refill takes less than 2 seconds.     Findings: No erythema or rash.  Neurological:     General: No focal deficit present.     Mental Status: He is alert and oriented to person, place, and time.  Psychiatric:        Mood and Affect: Mood normal.        Behavior: Behavior normal.        Thought Content: Thought content normal.        Judgment: Judgment normal.      UC Treatments / Results  Labs (all labs ordered are listed, but only abnormal results are displayed) Labs Reviewed - No data to display  EKG   Radiology No results found.  Procedures Procedures (including critical care time)  Medications Ordered in UC Medications - No data to display  Initial Impression / Assessment and Plan / UC Course  I have reviewed the triage vital signs and the nursing notes.  Pertinent labs & imaging results that were available during my care of the patient were reviewed by me and considered in my medical decision  making (see chart for details).   Patient is here for evaluation of continued cough and nasal congestion has been going on for the past 3 weeks.  Patient  has been negative on multiple occasions for COVID with his most recent test being yesterday and it was also negative.  Patient's physical exam reveals erythematous and edematous nasal mucosa with clear nasal discharge.  No cervical lymphadenopathy appreciated.  Lungs are clear to auscultation all fields.  We will discharge patient home with a diagnosis of URI with cough, give Tessalon Perles and Promethazine DM for cough and congestion, and clear patient to return to work.   Final Clinical Impressions(s) / UC Diagnoses   Final diagnoses:  Viral URI with cough     Discharge Instructions     Use the Tessalon Perles during the day for cough.  You can take 1 every 8 hours.  They may give you some numbness to the base of your tongue or metallic taste in your mouth, this is normal.  Use the Promethazine DM cough syrup at bedtime, as it will make you drowsy, for cough and congestion.      ED Prescriptions    Medication Sig Dispense Auth. Provider   benzonatate (TESSALON) 100 MG capsule Take 2 capsules (200 mg total) by mouth every 8 (eight) hours. 21 capsule Becky Augusta, NP   promethazine-dextromethorphan (PROMETHAZINE-DM) 6.25-15 MG/5ML syrup Take 5 mLs by mouth 4 (four) times daily as needed. 118 mL Becky Augusta, NP     PDMP not reviewed this encounter.   Becky Augusta, NP 04/12/20 [phone]

## 2020-04-12 NOTE — Discharge Instructions (Addendum)
Use the Tessalon Perles during the day for cough.  You can take 1 every 8 hours.  They may give you some numbness to the base of your tongue or metallic taste in your mouth, this is normal.  Use the Promethazine DM cough syrup at bedtime, as it will make you drowsy, for cough and congestion.

## 2020-06-27 IMAGING — CR DG CHEST 2V
2 series · 2 of 2 positions shown · non-contrast
Comparison: 06/11/2018

CLINICAL DATA: Cough and shortness of breath

EXAM:
CHEST - 2 VIEW

[chest pa]
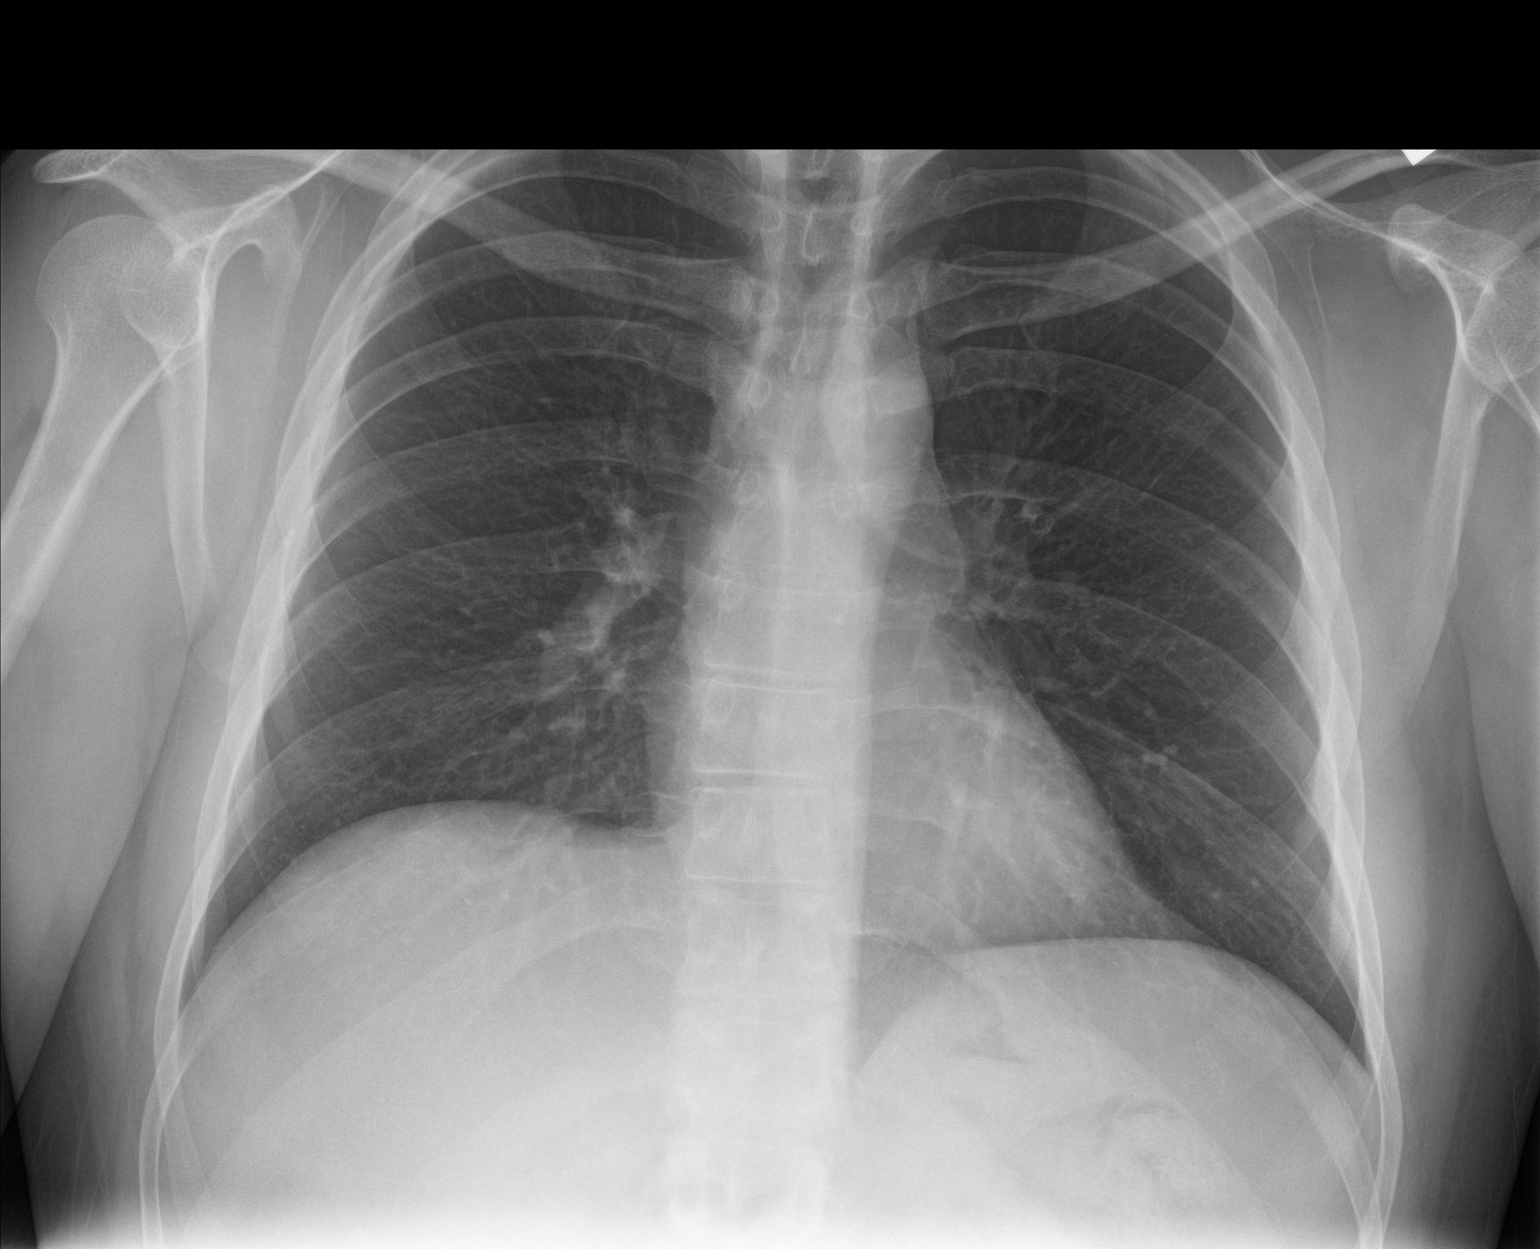

[chest lat]
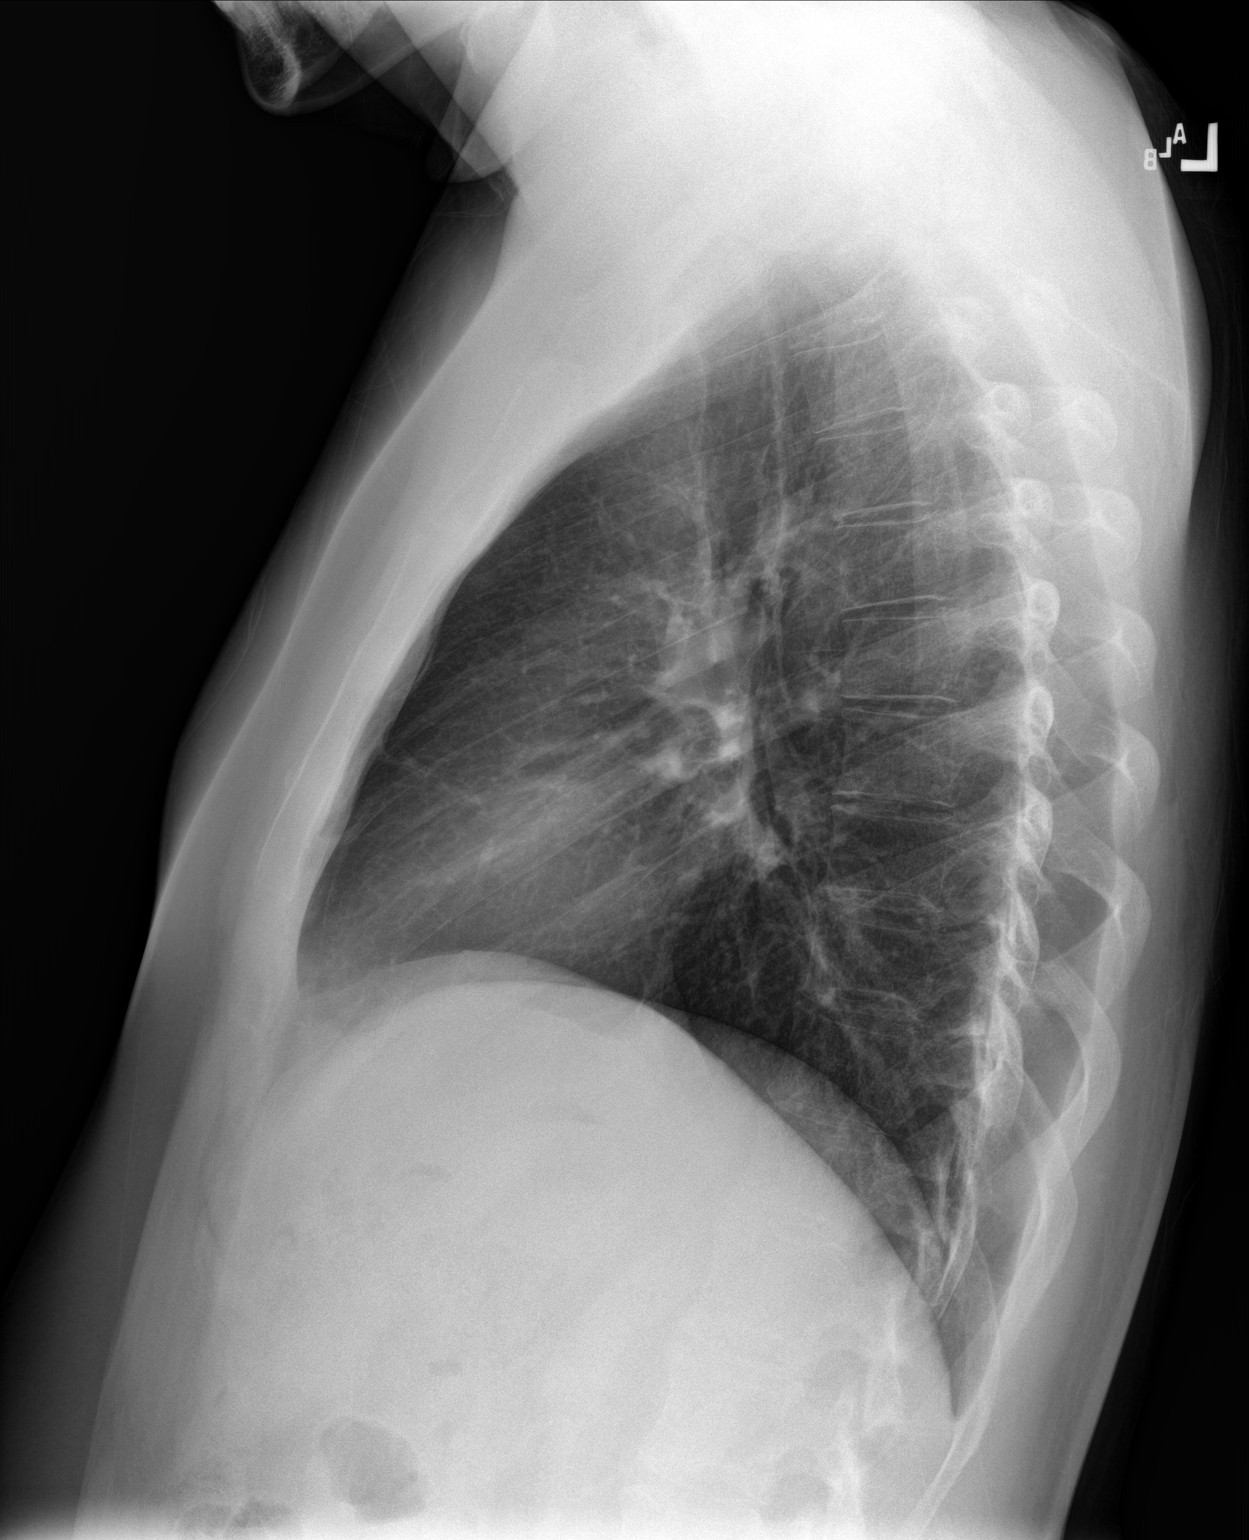

[2 of 2 positions shown; findings below may reference images not displayed]

FINDINGS: The heart size and mediastinal contours are within normal limits.
Both lungs are clear. The visualized skeletal structures are
unremarkable.
IMPRESSION: No active cardiopulmonary disease.

## 2020-10-09 ENCOUNTER — Other Ambulatory Visit: Payer: Self-pay

## 2020-10-09 ENCOUNTER — Ambulatory Visit (LOCAL_COMMUNITY_HEALTH_CENTER): Payer: Self-pay

## 2020-10-09 DIAGNOSIS — Z111 Encounter for screening for respiratory tuberculosis: Secondary | ICD-10-CM

## 2020-10-12 ENCOUNTER — Other Ambulatory Visit: Payer: Self-pay

## 2020-10-12 ENCOUNTER — Ambulatory Visit (LOCAL_COMMUNITY_HEALTH_CENTER): Payer: Self-pay

## 2020-10-12 DIAGNOSIS — Z111 Encounter for screening for respiratory tuberculosis: Secondary | ICD-10-CM

## 2020-10-12 LAB — TB SKIN TEST
Induration: 0 mm
TB Skin Test: NEGATIVE

## 2023-03-11 ENCOUNTER — Encounter: Payer: Self-pay | Admitting: Emergency Medicine

## 2023-03-11 ENCOUNTER — Ambulatory Visit
Admission: EM | Admit: 2023-03-11 | Discharge: 2023-03-11 | Disposition: A | Discharge status: Home / Self Care | Payer: Managed Care, Other (non HMO) | Attending: Physician Assistant | Admitting: Physician Assistant

## 2023-03-11 DIAGNOSIS — L600 Ingrowing nail: Secondary | ICD-10-CM | POA: Diagnosis not present

## 2023-03-11 DIAGNOSIS — L989 Disorder of the skin and subcutaneous tissue, unspecified: Secondary | ICD-10-CM | POA: Diagnosis not present

## 2023-03-11 NOTE — ED Provider Notes (Signed)
[facility]    CSN: [id number] Arrival date & time: 03/11/23  1251      History   Chief Complaint Chief Complaint  Patient presents with   Bump     HPI Nicholas [name] is a 32 y.o. male presenting for 2 separate complaints.   He reports a growth on the skin of his left lower leg for the past couple months.  He says it initially started out flat, dry and like a scab.  It is now raised.  He says it will peel at times.  It is not painful.  It has not bled or drained.  He has no history of similar skin lesions or skin cancers.  Additionally reports that both of his right great toes are with ingrown toenails but it is mild.  Denies any significant pain, swelling, redness, pustular drainage or bleeding.  No fevers.  Has tried to manicure his feet at home.  HPI  History reviewed. No pertinent past medical history.  There are no active problems to display for this patient.   Past Surgical History:  Procedure Laterality Date   NO PAST SURGERIES         Home Medications    Prior to Admission medications   Medication Sig Start Date End Date Taking? Authorizing Provider  albuterol (VENTOLIN HFA) 108 (90 Base) MCG/ACT inhaler Inhale 2 puffs into the lungs every 4 (four) hours as needed for wheezing or shortness of breath. 04/08/19 12/15/19  Rodriguez-Southworth, Nettie Elm, PA-C  fluticasone (FLONASE) 50 MCG/ACT nasal spray Place 2 sprays into both nostrils 2 (two) times daily for 7 days. 12/01/17 11/24/18  AmyotAli Lowe, NP    Family History Family History  Problem Relation Age of Onset   Healthy Mother    Healthy Father     Social History Social History   Tobacco Use   Smoking status: Never   Smokeless tobacco: Never  Vaping Use   Vaping status: Never Used  Substance Use Topics   Alcohol use: Yes    Comment: socially    Drug use: Not Currently     Allergies   Patient has no known allergies.   Review of Systems Review of Systems   Constitutional:  Negative for fatigue and fever.  Musculoskeletal:  Negative for arthralgias and joint swelling.  Skin:  Positive for color change. Negative for wound.       Skin lesion  Neurological:  Negative for weakness.     Physical Exam Triage Vital Signs ED Triage Vitals  Encounter Vitals Group     BP 03/11/23 1325 113/82     Systolic BP Percentile --      Diastolic BP Percentile --      Pulse Rate 03/11/23 1325 86     Resp 03/11/23 1325 18     Temp 03/11/23 1325 98.5 F (36.9 C)     Temp Source 03/11/23 1325 Oral     SpO2 03/11/23 1325 97 %     Weight --      Height --      Head Circumference --      Peak Flow --      Pain Score 03/11/23 1324 3     Pain Loc --      Pain Education --      Exclude from Growth Chart --    No data found.  Updated Vital Signs BP 113/82 (BP Location: Right Arm)   Pulse 86   Temp 98.5 F (36.9 C) (  Oral)   Resp 18   SpO2 97%      Physical Exam Vitals and nursing note reviewed.  Constitutional:      General: He is not in acute distress.    Appearance: Normal appearance. He is well-developed. He is not ill-appearing.  HENT:     Head: Normocephalic and atraumatic.  Eyes:     General: No scleral icterus.    Conjunctiva/sclera: Conjunctivae normal.  Cardiovascular:     Rate and Rhythm: Normal rate.     Pulses: Normal pulses.  Pulmonary:     Effort: Pulmonary effort is normal. No respiratory distress.  Musculoskeletal:     Cervical back: Neck supple.  Skin:    General: Skin is warm and dry.     Capillary Refill: Capillary refill takes less than 2 seconds.     Comments: Left lower leg: There is a raised papule with surrounding slight erythema and dry scaly skin.  It is nontender.  No drainage.  Very mild ingrown toenails of bilateral great toes.  Slight swelling of cuticle of both toenails medially.  No pustular drainage, bleeding or tenderness.  Neurological:     General: No focal deficit present.     Mental Status: He is  alert. Mental status is at baseline.     Motor: No weakness.     Gait: Gait normal.  Psychiatric:        Mood and Affect: Mood normal.        Behavior: Behavior normal.      UC Treatments / Results  Labs (all labs ordered are listed, but only abnormal results are displayed) Labs Reviewed - No data to display  EKG   Radiology No results found.  Procedures Procedures (including critical care time)  Medications Ordered in UC Medications - No data to display  Initial Impression / Assessment and Plan / UC Course  I have reviewed the triage vital signs and the nursing notes.  Pertinent labs & imaging results that were available during my care of the patient were reviewed by me and considered in my medical decision making (see chart for details).   32 year old male presents for 2 separate complaints.  Initially reports skin lesion of left lower leg that has been present for the past couple of months.  Image included in chart.  Suspicious for skin cancer.  Explained that he needs to have this biopsied by dermatology.  I placed a referral to dermatology for him and we also set him up with a primary care provider in case he needs an additional referral.  In regards to the ingrown toenails they are very mild.  Discussed care of ingrown toenails at home with Epsom salt soaks, hydrocortisone cream and advised him to get a professional pedicure.  Reviewed returning for any signs of infection.   Final Clinical Impressions(s) / UC Diagnoses   Final diagnoses:  Skin lesion of left leg  Ingrown toenail of both feet     Discharge Instructions      -I put a referral to dermatology for you.  I think you might have skin cancer.  You need to have this area excised and biopsied to test it. - We made you an appoint with primary care provider.  If for some reason you need a different dermatology referral they can place this for you. - In regards to the ingrown toenails begin Epsom salt warm  soaks 1-2 times a day and use hydrocortisone cream to get the little bit of swelling  down around the cuticle.  In about a week go get a pedicure.     ED Prescriptions   None    PDMP not reviewed this encounter.   Shirlee Latch, PA-C 03/11/23 1401

## 2023-03-11 NOTE — ED Triage Notes (Signed)
Pt presents with a bump on his left shin that has grown over the past 2 months. He also c/o bilateral big toe pain x 1- 2 months.

## 2023-03-11 NOTE — Discharge Instructions (Addendum)
-  I put a referral to dermatology for you.  I think you might have skin cancer.  You need to have this area excised and biopsied to test it. - We made you an appoint with primary care provider.  If for some reason you need a different dermatology referral they can place this for you. - In regards to the ingrown toenails begin Epsom salt warm soaks 1-2 times a day and use hydrocortisone cream to get the little bit of swelling down around the cuticle.  In about a week go get a pedicure.

## 2023-04-21 ENCOUNTER — Ambulatory Visit: Payer: Self-pay | Admitting: Physician Assistant
